# Patient Record
Sex: Male | Born: 1970 | Race: White | Hispanic: No | Marital: Single | Smoking: Current every day smoker
Health system: Southern US, Community
[De-identification: ages and names within clinical notes are randomized; demographics above are authoritative.]

## PROBLEM LIST (undated history)

## (undated) DIAGNOSIS — S149XXA Injury of unspecified nerves of neck, initial encounter: Secondary | ICD-10-CM

## (undated) DIAGNOSIS — K219 Gastro-esophageal reflux disease without esophagitis: Secondary | ICD-10-CM

## (undated) DIAGNOSIS — G589 Mononeuropathy, unspecified: Secondary | ICD-10-CM

## (undated) DIAGNOSIS — I10 Essential (primary) hypertension: Secondary | ICD-10-CM

---
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
## 2005-08-01 HISTORY — PX: SEPTOPLASTY: SUR1290

## 2006-05-25 ENCOUNTER — Ambulatory Visit: Payer: Self-pay | Admitting: Internal Medicine

## 2006-07-07 ENCOUNTER — Other Ambulatory Visit: Payer: Self-pay

## 2006-07-13 ENCOUNTER — Ambulatory Visit: Payer: Self-pay | Admitting: Otolaryngology

## 2010-07-20 ENCOUNTER — Observation Stay: Payer: Self-pay | Admitting: Internal Medicine

## 2015-05-11 ENCOUNTER — Other Ambulatory Visit: Payer: Self-pay | Admitting: Internal Medicine

## 2015-05-11 DIAGNOSIS — M5010 Cervical disc disorder with radiculopathy, unspecified cervical region: Secondary | ICD-10-CM

## 2015-05-20 ENCOUNTER — Ambulatory Visit: Payer: Self-pay

## 2015-07-10 ENCOUNTER — Ambulatory Visit
Admission: RE | Admit: 2015-07-10 | Discharge: 2015-07-10 | Disposition: A | Discharge status: Home / Self Care | Payer: 59 | Source: Ambulatory Visit | Attending: Internal Medicine | Admitting: Internal Medicine

## 2015-07-10 DIAGNOSIS — M5010 Cervical disc disorder with radiculopathy, unspecified cervical region: Secondary | ICD-10-CM | POA: Diagnosis present

## 2015-07-10 DIAGNOSIS — R202 Paresthesia of skin: Secondary | ICD-10-CM | POA: Insufficient documentation

## 2015-07-10 DIAGNOSIS — M2578 Osteophyte, vertebrae: Secondary | ICD-10-CM | POA: Insufficient documentation

## 2015-07-10 DIAGNOSIS — R531 Weakness: Secondary | ICD-10-CM | POA: Insufficient documentation

## 2015-07-10 DIAGNOSIS — R200 Anesthesia of skin: Secondary | ICD-10-CM | POA: Diagnosis not present

## 2015-07-10 DIAGNOSIS — M4802 Spinal stenosis, cervical region: Secondary | ICD-10-CM | POA: Insufficient documentation

## 2015-07-10 DIAGNOSIS — M50221 Other cervical disc displacement at C4-C5 level: Secondary | ICD-10-CM | POA: Diagnosis not present

## 2015-08-13 ENCOUNTER — Other Ambulatory Visit: Payer: Self-pay | Admitting: Nurse Practitioner

## 2015-08-13 DIAGNOSIS — Z800 Family history of malignant neoplasm of digestive organs: Secondary | ICD-10-CM

## 2015-08-13 DIAGNOSIS — Z8371 Family history of colonic polyps: Secondary | ICD-10-CM

## 2015-08-13 DIAGNOSIS — R197 Diarrhea, unspecified: Secondary | ICD-10-CM

## 2015-08-24 ENCOUNTER — Ambulatory Visit
Admission: RE | Admit: 2015-08-24 | Discharge: 2015-08-24 | Disposition: A | Discharge status: Home / Self Care | Payer: 59 | Source: Ambulatory Visit | Attending: Nurse Practitioner | Admitting: Nurse Practitioner

## 2015-08-24 ENCOUNTER — Encounter: Payer: Self-pay | Admitting: *Deleted

## 2015-08-24 DIAGNOSIS — K929 Disease of digestive system, unspecified: Secondary | ICD-10-CM | POA: Insufficient documentation

## 2015-08-24 DIAGNOSIS — Z800 Family history of malignant neoplasm of digestive organs: Secondary | ICD-10-CM | POA: Insufficient documentation

## 2015-08-24 DIAGNOSIS — Z8371 Family history of colonic polyps: Secondary | ICD-10-CM | POA: Diagnosis present

## 2015-08-24 DIAGNOSIS — R197 Diarrhea, unspecified: Secondary | ICD-10-CM | POA: Diagnosis present

## 2015-08-24 HISTORY — DX: Essential (primary) hypertension: I10

## 2015-08-24 MED ORDER — IOHEXOL 300 MG/ML  SOLN
100.0000 mL | Freq: Once | INTRAMUSCULAR | Status: AC | PRN
  Administered 2015-08-24: 100 mL via INTRAVENOUS

## 2015-08-25 NOTE — Discharge Instructions (Signed)

## 2015-08-26 ENCOUNTER — Ambulatory Visit
Admission: RE | Admit: 2015-08-26 | Discharge: 2015-08-26 | Disposition: A | Discharge status: Home / Self Care | Payer: 59 | Source: Ambulatory Visit | Attending: Gastroenterology | Admitting: Gastroenterology

## 2015-08-26 ENCOUNTER — Ambulatory Visit: Payer: 59

## 2015-08-26 ENCOUNTER — Ambulatory Visit: Payer: 59 | Admitting: Student in an Organized Health Care Education/Training Program

## 2015-08-26 ENCOUNTER — Encounter
Admission: RE | Disposition: A | Discharge status: Home / Self Care | Payer: Self-pay | Source: Ambulatory Visit | Attending: Gastroenterology

## 2015-08-26 DIAGNOSIS — Z79899 Other long term (current) drug therapy: Secondary | ICD-10-CM | POA: Diagnosis not present

## 2015-08-26 DIAGNOSIS — Z800 Family history of malignant neoplasm of digestive organs: Secondary | ICD-10-CM | POA: Insufficient documentation

## 2015-08-26 DIAGNOSIS — Z7982 Long term (current) use of aspirin: Secondary | ICD-10-CM | POA: Insufficient documentation

## 2015-08-26 DIAGNOSIS — Z881 Allergy status to other antibiotic agents status: Secondary | ICD-10-CM | POA: Diagnosis not present

## 2015-08-26 DIAGNOSIS — Z8049 Family history of malignant neoplasm of other genital organs: Secondary | ICD-10-CM | POA: Insufficient documentation

## 2015-08-26 DIAGNOSIS — Z888 Allergy status to other drugs, medicaments and biological substances status: Secondary | ICD-10-CM | POA: Insufficient documentation

## 2015-08-26 DIAGNOSIS — K449 Diaphragmatic hernia without obstruction or gangrene: Secondary | ICD-10-CM | POA: Diagnosis not present

## 2015-08-26 DIAGNOSIS — F17200 Nicotine dependence, unspecified, uncomplicated: Secondary | ICD-10-CM | POA: Diagnosis not present

## 2015-08-26 DIAGNOSIS — Z880 Allergy status to penicillin: Secondary | ICD-10-CM | POA: Diagnosis not present

## 2015-08-26 DIAGNOSIS — K529 Noninfective gastroenteritis and colitis, unspecified: Secondary | ICD-10-CM | POA: Insufficient documentation

## 2015-08-26 DIAGNOSIS — R197 Diarrhea, unspecified: Secondary | ICD-10-CM | POA: Diagnosis not present

## 2015-08-26 DIAGNOSIS — Z885 Allergy status to narcotic agent status: Secondary | ICD-10-CM | POA: Insufficient documentation

## 2015-08-26 DIAGNOSIS — Z86010 Personal history of colonic polyps: Secondary | ICD-10-CM | POA: Diagnosis not present

## 2015-08-26 DIAGNOSIS — Z8371 Family history of colonic polyps: Secondary | ICD-10-CM | POA: Diagnosis present

## 2015-08-26 DIAGNOSIS — Z803 Family history of malignant neoplasm of breast: Secondary | ICD-10-CM | POA: Insufficient documentation

## 2015-08-26 HISTORY — DX: Mononeuropathy, unspecified: G58.9

## 2015-08-26 HISTORY — PX: ESOPHAGOGASTRODUODENOSCOPY: SHX5428

## 2015-08-26 HISTORY — DX: Injury of unspecified nerves of neck, initial encounter: S14.9XXA

## 2015-08-26 HISTORY — PX: COLONOSCOPY: SHX5424

## 2015-08-26 HISTORY — DX: Gastro-esophageal reflux disease without esophagitis: K21.9

## 2015-08-26 SURGERY — COLONOSCOPY
Anesthesia: General | Wound class: Contaminated

## 2015-08-26 MED ORDER — DEXAMETHASONE SODIUM PHOSPHATE 4 MG/ML IJ SOLN: 8.0000 mg | Freq: Once | INTRAMUSCULAR | Status: DC | PRN

## 2015-08-26 MED ORDER — ACETAMINOPHEN 160 MG/5ML PO SOLN: 325.0000 mg | ORAL | Status: DC | PRN

## 2015-08-26 MED ORDER — LACTATED RINGERS IV SOLN: INTRAVENOUS | Status: DC

## 2015-08-26 MED ORDER — LACTATED RINGERS IV SOLN: 500.0000 mL | INTRAVENOUS | Status: DC

## 2015-08-26 MED ORDER — STERILE WATER FOR IRRIGATION IR SOLN
Status: DC | PRN
  Administered 2015-08-26: 10:00:00

## 2015-08-26 MED ORDER — FENTANYL CITRATE (PF) 100 MCG/2ML IJ SOLN: 25.0000 ug | INTRAMUSCULAR | Status: DC | PRN

## 2015-08-26 MED ORDER — PROPOFOL 10 MG/ML IV BOLUS
INTRAVENOUS | Status: DC | PRN
  Administered 2015-08-26 (×14): 20 mg via INTRAVENOUS

## 2015-08-26 MED ORDER — ACETAMINOPHEN 325 MG PO TABS: 325.0000 mg | ORAL_TABLET | ORAL | Status: DC | PRN

## 2015-08-26 SURGICAL SUPPLY — 30 items
CANISTER SUCT 1200ML W/VALVE (MISCELLANEOUS) ×3 IMPLANT
FCP ESCP3.2XJMB 240X2.8X (MISCELLANEOUS)
FORCEPS BIOP RAD 4 LRG CAP 4 (CUTTING FORCEPS) ×3 IMPLANT
FORCEPS BIOP RJ4 240 W/NDL (MISCELLANEOUS)
FORCEPS ESCP3.2XJMB 240X2.8X (MISCELLANEOUS) IMPLANT
GOWN CVR UNV OPN BCK APRN NK (MISCELLANEOUS) ×1 IMPLANT
GOWN ISOL THUMB LOOP REG UNIV (MISCELLANEOUS) ×2
GOWN STRL REUS W/ TWL LRG LVL3 (GOWN DISPOSABLE) ×1 IMPLANT
GOWN STRL REUS W/TWL LRG LVL3 (GOWN DISPOSABLE) ×2
HEMOCLIP INSTINCT (CLIP) IMPLANT
INJECTOR VARIJECT VIN23 (MISCELLANEOUS) IMPLANT
KIT CO2 TUBING (TUBING) IMPLANT
KIT DEFENDO VALVE AND CONN (KITS) IMPLANT
KIT ENDO PROCEDURE OLY (KITS) ×3 IMPLANT
LIGATOR MULTIBAND 6SHOOTER MBL (MISCELLANEOUS) IMPLANT
MARKER SPOT ENDO TATTOO 5ML (MISCELLANEOUS) IMPLANT
PAD GROUND ADULT SPLIT (MISCELLANEOUS) IMPLANT
SNARE SHORT THROW 13M SML OVAL (MISCELLANEOUS) IMPLANT
SNARE SHORT THROW 30M LRG OVAL (MISCELLANEOUS) IMPLANT
SPOT EX ENDOSCOPIC TATTOO (MISCELLANEOUS)
SUCTION POLY TRAP 4CHAMBER (MISCELLANEOUS) IMPLANT
TRAP SUCTION POLY (MISCELLANEOUS) IMPLANT
TUBING CONN 6MMX3.1M (TUBING)
TUBING SUCTION CONN 0.25 STRL (TUBING) IMPLANT
UNDERPAD 30X60 958B10 (PK) (MISCELLANEOUS) IMPLANT
VALVE BIOPSY ENDO (VALVE) IMPLANT
VARIJECT INJECTOR VIN23 (MISCELLANEOUS)
WATER AUXILLARY (MISCELLANEOUS) IMPLANT
WATER STERILE IRR 250ML POUR (IV SOLUTION) IMPLANT
WATER STERILE IRR 500ML POUR (IV SOLUTION) IMPLANT

## 2015-08-26 NOTE — Anesthesia Postprocedure Evaluation (Signed)
Anesthesia Post Note  Patient: Levi Sheppard  Procedure(s) Performed: Procedure(s) (LRB): COLONOSCOPY (N/A) ESOPHAGOGASTRODUODENOSCOPY (EGD) (N/A)  Patient location during evaluation: PACU Anesthesia Type: MAC Level of consciousness: awake and alert Pain management: pain level controlled Vital Signs Assessment: post-procedure vital signs reviewed and stable Respiratory status: spontaneous breathing, nonlabored ventilation and respiratory function stable Cardiovascular status: blood pressure returned to baseline and stable Postop Assessment: no signs of nausea or vomiting Anesthetic complications: no    Juno Alers D Dontee Jaso

## 2015-08-26 NOTE — Anesthesia Preprocedure Evaluation (Signed)
Anesthesia Evaluation  Patient identified by MRN, date of birth, ID band Patient awake    Reviewed: Allergy & Precautions, H&P , NPO status , Patient's Chart, lab work & pertinent test results, reviewed documented beta blocker date and time   Airway Mallampati: II  TM Distance: >3 FB Neck ROM: full    Dental no notable dental hx.    Pulmonary neg pulmonary ROS, Current Smoker,    Pulmonary exam normal breath sounds clear to auscultation       Cardiovascular Exercise Tolerance: Good hypertension, negative cardio ROS   Rhythm:regular Rate:Normal     Neuro/Psych negative neurological ROS  negative psych ROS   GI/Hepatic Neg liver ROS, GERD  Medicated,  Endo/Other  negative endocrine ROS  Renal/GU negative Renal ROS  negative genitourinary   Musculoskeletal   Abdominal   Peds  Hematology negative hematology ROS (+)   Anesthesia Other Findings   Reproductive/Obstetrics negative OB ROS                             Anesthesia Physical Anesthesia Plan  ASA: II  Anesthesia Plan: General   Post-op Pain Management:    Induction:   Airway Management Planned:   Additional Equipment:   Intra-op Plan:   Post-operative Plan:   Informed Consent: I have reviewed the patients History and Physical, chart, labs and discussed the procedure including the risks, benefits and alternatives for the proposed anesthesia with the patient or authorized representative who has indicated his/her understanding and acceptance.   Dental Advisory Given  Plan Discussed with: CRNA  Anesthesia Plan Comments:         Anesthesia Quick Evaluation

## 2015-08-26 NOTE — Op Note (Addendum)
[facility] Gastroenterology Patient Name: Levi Sheppard Procedure Date: 08/26/2015 9:20 AM MRN: [id number] Account #: [id number] Date of Birth: 11/08/1970 Admit Type: Outpatient Age: 45 Room: [facility] OR ROOM 01 Gender: Male Note Status: Supervisor Override Procedure:         Colonoscopy Indications:       Chronic diarrhea, Family history of colonic polyps in a                     first-degree relative Providers:         Ezzard Standing. Bluford Kaufmann, MD Medicines:         Monitored Anesthesia Care Complications:     No immediate complications. Procedure:         Pre-Anesthesia Assessment:                    - Prior to the procedure, a History and Physical was                     performed, and patient medications, allergies and                     sensitivities were reviewed. The patient's tolerance of                     previous anesthesia was reviewed.                    - The risks and benefits of the procedure and the sedation                     options and risks were discussed with the patient. All                     questions were answered and informed consent was obtained.                    - After reviewing the risks and benefits, the patient was                     deemed in satisfactory condition to undergo the procedure.                    After obtaining informed consent, the colonoscope was                     passed under direct vision. Throughout the procedure, the                     patient's blood pressure, pulse, and oxygen saturations                     were monitored continuously. The Olympus CF-HQ190L                     Colonoscope (S#. [id number]) was introduced through the anus                     and advanced to the the cecum, identified by appendiceal                     orifice and ileocecal valve. The patient tolerated the                     procedure well. The quality of the bowel  preparation was                     fair. The colonoscopy was performed  with difficulty due to                     significant looping. Successful completion of the                     procedure was aided by using manual pressure. Findings:      The colon (entire examined portion) appeared normal. Biopsies for       histology were taken with a cold forceps from the entire colon for       evaluation of microscopic colitis. Impression:        - The entire examined colon is normal. Biopsied. Recommendation:    - Discharge patient to home.                    - Repeat colonoscopy in 5 years for surveillance.                    - The findings and recommendations were discussed with the                     patient. Procedure Code(s): --- Professional ---                    [id number], Colonoscopy, flexible; with biopsy, single or                     multiple Diagnosis Code(s): --- Professional ---                    K52.9, Noninfective gastroenteritis and colitis,                     unspecified                    Z83.71, Family history of colonic polyps CPT copyright 2014 American Medical Association. All rights reserved. The codes documented in this report are preliminary and upon coder review may  be revised to meet current compliance requirements. Wallace Cullens, MD 08/26/2015 9:53:06 AM This report has been signed electronically. Number of Addenda: 0 Note Initiated On: 08/26/2015 9:20 AM Scope Withdrawal Time: 0 hours 6 minutes 57 seconds  Total Procedure Duration: 0 hours 14 minutes 26 seconds       [facility]

## 2015-08-26 NOTE — Op Note (Signed)
[facility] Gastroenterology Patient Name: Levi Sheppard Procedure Date: 08/26/2015 9:20 AM MRN: [id number] Account #: [id number] Date of Birth: [birth year] Admit Type: Outpatient Age: 45 Room: [facility] OR ROOM 01 Gender: Male Note Status: Finalized Procedure:         Upper GI endoscopy Indications:       Suspected esophageal reflux Providers:         Ezzard Standing. Bluford Kaufmann, MD Referring MD:      Danella Penton, MD (Referring MD) Medicines:         Monitored Anesthesia Care Complications:     No immediate complications. Procedure:         Pre-Anesthesia Assessment:                    - Prior to the procedure, a History and Physical was                     performed, and patient medications, allergies and                     sensitivities were reviewed. The patient's tolerance of                     previous anesthesia was reviewed.                    - The risks and benefits of the procedure and the sedation                     options and risks were discussed with the patient. All                     questions were answered and informed consent was obtained.                    - After reviewing the risks and benefits, the patient was                     deemed in satisfactory condition to undergo the procedure.                    After obtaining informed consent, the endoscope was passed                     under direct vision. Throughout the procedure, the                     patient's blood pressure, pulse, and oxygen saturations                     were monitored continuously. The Olympus GIF H180J                     colonscope (M#:[id number]) was introduced through the mouth,                     and advanced to the second part of duodenum. The upper GI                     endoscopy was accomplished without difficulty. The patient                     tolerated the procedure well. Findings:      The examined esophagus was normal. Biopsies were taken with a  cold       forceps  for histology.      The examined duodenum was normal.      A small hiatus hernia was present.      The exam was otherwise without abnormality. Impression:        - Normal esophagus. Biopsied.                    - Normal examined duodenum.                    - Small hiatus hernia.                    - The examination was otherwise normal. Recommendation:    - Discharge patient to home.                    - Observe patient's clinical course.                    - Continue present medications.                    - Await pathology results.                    - The findings and recommendations were discussed with the                     patient. Procedure Code(s): --- Professional ---                    [phone], Esophagogastroduodenoscopy, flexible, transoral;                     with biopsy, single or multiple Diagnosis Code(s): --- Professional ---                    K44.9, Diaphragmatic hernia without obstruction or gangrene CPT copyright 2014 American Medical Association. All rights reserved. The codes documented in this report are preliminary and upon coder review may  be revised to meet current compliance requirements. Wallace Cullens, MD 08/26/2015 9:57:12 AM This report has been signed electronically. Number of Addenda: 0 Note Initiated On: 08/26/2015 9:20 AM      [facility]

## 2015-08-26 NOTE — Transfer of Care (Signed)
Immediate Anesthesia Transfer of Care Note  Patient: Levi Sheppard  Procedure(s) Performed: Procedure(s): COLONOSCOPY (N/A) ESOPHAGOGASTRODUODENOSCOPY (EGD) (N/A)  Patient Location: PACU  Anesthesia Type: General  Level of Consciousness: awake, alert  and patient cooperative  Airway and Oxygen Therapy: Patient Spontanous Breathing and Patient connected to supplemental oxygen  Post-op Assessment: Post-op Vital signs reviewed, Patient's Cardiovascular Status Stable, Respiratory Function Stable, Patent Airway and No signs of Nausea or vomiting  Post-op Vital Signs: Reviewed and stable  Complications: No apparent anesthesia complications

## 2015-08-26 NOTE — Anesthesia Procedure Notes (Signed)
Procedure Name: MAC Performed by: Charnee Turnipseed Pre-anesthesia Checklist: Patient identified, Emergency Drugs available, Suction available, Patient being monitored and Timeout performed Patient Re-evaluated:Patient Re-evaluated prior to inductionOxygen Delivery Method: Nasal cannula Preoxygenation: Pre-oxygenation with 100% oxygen     

## 2015-08-26 NOTE — H&P (Signed)
  Date of Initial H&P: 08/13/2015  History reviewed, patient examined, no change in status, stable for surgery.

## 2015-08-27 ENCOUNTER — Encounter: Payer: Self-pay | Admitting: Gastroenterology

## 2015-08-28 LAB — SURGICAL PATHOLOGY

## 2015-09-03 ENCOUNTER — Ambulatory Visit: Admit: 2015-09-03 | Payer: 59 | Admitting: Gastroenterology

## 2015-09-03 SURGERY — COLONOSCOPY WITH PROPOFOL
Anesthesia: General

## 2016-03-24 ENCOUNTER — Other Ambulatory Visit: Payer: Self-pay | Admitting: Internal Medicine

## 2016-03-24 DIAGNOSIS — M5010 Cervical disc disorder with radiculopathy, unspecified cervical region: Secondary | ICD-10-CM

## 2016-04-05 ENCOUNTER — Ambulatory Visit: Payer: 59

## 2016-04-05 ENCOUNTER — Ambulatory Visit
Admission: RE | Admit: 2016-04-05 | Discharge: 2016-04-05 | Disposition: A | Discharge status: Home / Self Care | Payer: 59 | Source: Ambulatory Visit | Attending: Internal Medicine | Admitting: Internal Medicine

## 2016-04-05 DIAGNOSIS — M4802 Spinal stenosis, cervical region: Secondary | ICD-10-CM | POA: Insufficient documentation

## 2016-04-05 DIAGNOSIS — M5010 Cervical disc disorder with radiculopathy, unspecified cervical region: Secondary | ICD-10-CM | POA: Diagnosis not present

## 2016-04-05 DIAGNOSIS — M50223 Other cervical disc displacement at C6-C7 level: Secondary | ICD-10-CM | POA: Diagnosis not present

## 2019-01-26 ENCOUNTER — Observation Stay
Admission: EM | Admit: 2019-01-26 | Discharge: 2019-01-28 | Disposition: A | Discharge status: Home / Self Care | Payer: Managed Care, Other (non HMO) | Attending: Internal Medicine | Admitting: Internal Medicine

## 2019-01-26 ENCOUNTER — Other Ambulatory Visit: Payer: Self-pay

## 2019-01-26 ENCOUNTER — Emergency Department: Payer: Managed Care, Other (non HMO)

## 2019-01-26 DIAGNOSIS — Z880 Allergy status to penicillin: Secondary | ICD-10-CM | POA: Diagnosis not present

## 2019-01-26 DIAGNOSIS — Z791 Long term (current) use of non-steroidal anti-inflammatories (NSAID): Secondary | ICD-10-CM | POA: Insufficient documentation

## 2019-01-26 DIAGNOSIS — I214 Non-ST elevation (NSTEMI) myocardial infarction: Secondary | ICD-10-CM | POA: Diagnosis present

## 2019-01-26 DIAGNOSIS — Z23 Encounter for immunization: Secondary | ICD-10-CM | POA: Diagnosis not present

## 2019-01-26 DIAGNOSIS — Z79899 Other long term (current) drug therapy: Secondary | ICD-10-CM | POA: Insufficient documentation

## 2019-01-26 DIAGNOSIS — E785 Hyperlipidemia, unspecified: Secondary | ICD-10-CM | POA: Diagnosis not present

## 2019-01-26 DIAGNOSIS — F17210 Nicotine dependence, cigarettes, uncomplicated: Secondary | ICD-10-CM | POA: Insufficient documentation

## 2019-01-26 DIAGNOSIS — R079 Chest pain, unspecified: Secondary | ICD-10-CM | POA: Diagnosis present

## 2019-01-26 DIAGNOSIS — Z1159 Encounter for screening for other viral diseases: Secondary | ICD-10-CM | POA: Diagnosis not present

## 2019-01-26 DIAGNOSIS — R0789 Other chest pain: Secondary | ICD-10-CM | POA: Diagnosis not present

## 2019-01-26 DIAGNOSIS — Z6841 Body Mass Index (BMI) 40.0 and over, adult: Secondary | ICD-10-CM | POA: Insufficient documentation

## 2019-01-26 DIAGNOSIS — F419 Anxiety disorder, unspecified: Secondary | ICD-10-CM | POA: Diagnosis present

## 2019-01-26 DIAGNOSIS — I10 Essential (primary) hypertension: Secondary | ICD-10-CM | POA: Diagnosis not present

## 2019-01-26 DIAGNOSIS — Z885 Allergy status to narcotic agent status: Secondary | ICD-10-CM | POA: Insufficient documentation

## 2019-01-26 DIAGNOSIS — Z888 Allergy status to other drugs, medicaments and biological substances status: Secondary | ICD-10-CM | POA: Insufficient documentation

## 2019-01-26 DIAGNOSIS — Z881 Allergy status to other antibiotic agents status: Secondary | ICD-10-CM | POA: Insufficient documentation

## 2019-01-26 DIAGNOSIS — Z7982 Long term (current) use of aspirin: Secondary | ICD-10-CM | POA: Diagnosis not present

## 2019-01-26 DIAGNOSIS — K219 Gastro-esophageal reflux disease without esophagitis: Secondary | ICD-10-CM | POA: Diagnosis not present

## 2019-01-26 LAB — URINALYSIS, COMPLETE (UACMP) WITH MICROSCOPIC
Bacteria, UA: NONE SEEN
Bilirubin Urine: NEGATIVE
Glucose, UA: NEGATIVE mg/dL
Hgb urine dipstick: NEGATIVE
Ketones, ur: NEGATIVE mg/dL
Leukocytes,Ua: NEGATIVE
Nitrite: NEGATIVE
Protein, ur: NEGATIVE mg/dL
Specific Gravity, Urine: 1.006 (ref 1.005–1.030)
Squamous Epithelial / HPF: NONE SEEN (ref 0–5)
pH: 6 (ref 5.0–8.0)

## 2019-01-26 LAB — HEPATIC FUNCTION PANEL
ALT: 35 U/L (ref 0–44)
AST: 30 U/L (ref 15–41)
Albumin: 4.1 g/dL (ref 3.5–5.0)
Alkaline Phosphatase: 57 U/L (ref 38–126)
Bilirubin, Direct: <0.1 mg/dL (ref 0.0–0.2)
Total Bilirubin: 0.6 mg/dL (ref 0.3–1.2)
Total Protein: 7.2 g/dL (ref 6.5–8.1)

## 2019-01-26 LAB — BASIC METABOLIC PANEL
Anion gap: 11 (ref 5–15)
BUN: 19 mg/dL (ref 6–20)
CO2: 25 mmol/L (ref 22–32)
Calcium: 9.1 mg/dL (ref 8.9–10.3)
Chloride: 100 mmol/L (ref 98–111)
Creatinine, Ser: 1.23 mg/dL (ref 0.61–1.24)
GFR calc Af Amer: >60 mL/min (ref >60)
GFR calc non Af Amer: >60 mL/min (ref >60)
Glucose, Bld: 94 mg/dL (ref 70–99)
Potassium: 4 mmol/L (ref 3.5–5.1)
Sodium: 136 mmol/L (ref 135–145)

## 2019-01-26 LAB — CBC
HCT: 35.2 % — ABNORMAL LOW (ref 39.0–52.0)
Hemoglobin: 10.8 g/dL — ABNORMAL LOW (ref 13.0–17.0)
MCH: 26.2 pg (ref 26.0–34.0)
MCHC: 30.7 g/dL (ref 30.0–36.0)
MCV: 85.2 fL (ref 80.0–100.0)
Platelets: 372 10*3/uL (ref 150–400)
RBC: 4.13 MIL/uL — ABNORMAL LOW (ref 4.22–5.81)
RDW: 14.7 % (ref 11.5–15.5)
WBC: 13.7 10*3/uL — ABNORMAL HIGH (ref 4.0–10.5)
nRBC: 0 % (ref 0.0–0.2)

## 2019-01-26 LAB — TROPONIN I (HIGH SENSITIVITY)
Troponin I (High Sensitivity): 94 ng/L — ABNORMAL HIGH (ref <18)
Troponin I (High Sensitivity): 198 ng/L (ref <18)

## 2019-01-26 MED ORDER — SODIUM CHLORIDE 0.9% FLUSH: 3.0000 mL | Freq: Once | INTRAVENOUS | Status: DC

## 2019-01-26 MED ORDER — SODIUM CHLORIDE 0.9 % IV BOLUS
500.0000 mL | Freq: Once | INTRAVENOUS | Status: AC
  Administered 2019-01-26: 500 mL via INTRAVENOUS

## 2019-01-26 NOTE — ED Triage Notes (Signed)
Reports working outside pressure washing the house, states working for 6 hours outside developed chest pain, along with n/v.

## 2019-01-26 NOTE — ED Provider Notes (Signed)
[facility] Emergency Department Provider Note  ____________________________________________   None    (approximate)  I have reviewed the triage vital signs and the nursing notes.   HISTORY  Chief Complaint Chest Pain   HPI Levi Sheppard is a 48 y.o. male who presents to the emergency department for treatment and evaluation of chest pain.  Patient was outside pressure washing for several hours today and began to have pain in the center of his chest.  He states that he does have a history of costochondritis and this felt similar, but he states that his left arm became slightly numb and tingly.  He denies any nausea.  He states that he was diaphoretic but again he was outside for significant period of time.  He was recently placed on doxycycline secondary to his Pseudomonas skin infection.  Otherwise, he has not had any recent illness.   Past Medical History:  Diagnosis Date  . GERD (gastroesophageal reflux disease)   . Hypertension   . Pinched nerve in neck    c4-C5. No limitations of neck movement    Patient Active Problem List   Diagnosis Date Noted  . Chest pain 01/27/2019  . HTN (hypertension) 01/27/2019  . GERD (gastroesophageal reflux disease) 01/27/2019  . Anxiety 01/27/2019    Past Surgical History:  Procedure Laterality Date  . COLONOSCOPY N/A 08/26/2015   Procedure: COLONOSCOPY;  Surgeon: Wallace CullensPaul Y Oh, MD;  [facility];  Service: Gastroenterology;  Laterality: N/A;  . ESOPHAGOGASTRODUODENOSCOPY N/A 08/26/2015   Procedure: ESOPHAGOGASTRODUODENOSCOPY (EGD);  Surgeon: Wallace CullensPaul Y Oh, MD;  [facility];  Service: Gastroenterology;  Laterality: N/A;  . NASAL SEPTUM SURGERY  1986  . SEPTOPLASTY  2007   with submucous resection    Prior to Admission medications   Medication Sig Start Date End Date Taking? Authorizing Provider  ALPRAZolam Prudy Feeler(XANAX) 0.25 MG tablet Take 0.25 mg by mouth at bedtime as needed for anxiety.    Yes [provider]  aspirin 325 MG EC tablet Take 325 mg by mouth daily.   Yes [provider]  doxycycline (PERIOSTAT) 20 MG tablet Take 20 mg by mouth 2 (two) times daily. 01/24/19  Yes [provider]  DULoxetine (CYMBALTA) 60 MG capsule Take 60 mg by mouth daily. AM   Yes [provider]  gabapentin (NEURONTIN) 300 MG capsule Take 300 mg by mouth daily. 11/11/18  Yes [provider]  Melatonin 10 MG CAPS Take 20 mg by mouth at bedtime.   Yes [provider]  meloxicam (MOBIC) 7.5 MG tablet Take 7.5 mg by mouth daily. 01/25/19  Yes [provider]  Multiple Vitamin (MULTI-VITAMIN) tablet Take 1 tablet by mouth daily.   Yes [provider]  olmesartan-hydrochlorothiazide (BENICAR HCT) 40-12.5 MG tablet Take 1 tablet by mouth daily. 01/12/19  Yes [provider]  omeprazole (PRILOSEC) 20 MG capsule Take 20 mg by mouth daily. AM   Yes [provider]  zolpidem (AMBIEN) 10 MG tablet Take 10 mg by mouth at bedtime as needed for sleep.   Yes [provider]    Allergies Codeine, Compazine [prochlorperazine], Keflex [cephalexin], and Penicillins  History reviewed. No pertinent family history.  Social History Social History   Tobacco Use  . Smoking status: Current Every Day Smoker    Packs/day: 0.50    Years: 20.00    Pack years: 10.00    Types: Cigarettes  . Smokeless tobacco: Never Used  Substance Use Topics  .  Alcohol use: Yes    Alcohol/week: 5.0 standard drinks    Types: 5 Glasses of wine per week  . Drug use: Not on file    Review of Systems  Constitutional: No fever/chills. Eyes: No visual changes. ENT: No sore throat. Cardiovascular: Positive for chest pain.  Negative for pleuritic pain.  Negative for palpitations.  Negative for leg pain. Respiratory: Negative shortness of breath. Gastrointestinal: Negative abdominal pain.  Negative for nausea, no vomiting.  No diarrhea.  No  constipation. Genitourinary: Negative for dysuria. Musculoskeletal: Negative for back pain.  Skin: Negative for rash, lesion, wound. Neurological: Negative for headaches, focal weakness or numbness. ____________________________________________   PHYSICAL EXAM:  VITAL SIGNS: ED Triage Vitals  Enc Vitals Group     BP 01/26/19 1855 (!) 150/77     Pulse Rate 01/26/19 1855 65     Resp 01/26/19 1855 18     Temp 01/26/19 1855 97.9 F (36.6 C)     Temp Source 01/26/19 1855 Oral     SpO2 01/26/19 1851 99 %     Weight 01/26/19 1900 148 lb 12.5 oz (67.5 kg)     Height 01/26/19 1900 6\' 4"  (1.93 m)     Head Circumference --      Peak Flow --      Pain Score 01/26/19 1859 6     Pain Loc --      Pain Edu? --      Excl. in [place]? --     Constitutional: Alert and oriented.  Overall well appearing and in no acute distress.  Normal mental status. Eyes: Conjunctivae are normal. PERRL. Head: Atraumatic. Nose: No congestion/rhinnorhea. Mouth/Throat: Mucous membranes are moist.  Oropharynx non-erythematous. Tongue normal in size and color. Neck: No stridor.  No carotid bruit appreciated on exam. Hematological/Lymphatic/Immunilogical: No cervical lymphadenopathy. Cardiovascular: Normal rate, regular rhythm. Grossly normal heart sounds.  Good peripheral circulation. Respiratory: Normal respiratory effort.  No retractions. Lungs CTAB. Gastrointestinal: Soft and nontender. No distention. No abdominal bruits. No CVA tenderness. Genitourinary: Exam deferred. Musculoskeletal: No lower extremity tenderness.  No edema of extremities. Neurologic:  Normal speech and language. No gross focal neurologic deficits are appreciated. Skin:  Skin is warm, dry and intact. No rash noted. Psychiatric: Mood and affect are normal. Speech and behavior are normal.  ____________________________________________   LABS (all labs ordered are listed, but only abnormal results are displayed)  Labs Reviewed  CBC -  Abnormal; Notable for the following components:      Result Value   WBC 13.7 (*)    RBC 4.13 (*)    Hemoglobin 10.8 (*)    HCT 35.2 (*)    All other components within normal limits  TROPONIN I (HIGH SENSITIVITY) - Abnormal; Notable for the following components:   Troponin I (High Sensitivity) 94 (*)    All other components within normal limits  TROPONIN I (HIGH SENSITIVITY) - Abnormal; Notable for the following components:   Troponin I (High Sensitivity) 198 (*)    All other components within normal limits  URINALYSIS, COMPLETE (UACMP) WITH MICROSCOPIC - Abnormal; Notable for the following components:   Color, Urine YELLOW (*)    APPearance CLEAR (*)    All other components within normal limits  BASIC METABOLIC PANEL - Abnormal; Notable for the following components:   Glucose, Bld 106 (*)    All other components within normal limits  CBC - Abnormal; Notable for the following components:   RBC 4.10 (*)    Hemoglobin 10.7 (*)  HCT 35.3 (*)    All other components within normal limits  TROPONIN I (HIGH SENSITIVITY) - Abnormal; Notable for the following components:   Troponin I (High Sensitivity) 177 (*)    All other components within normal limits  TROPONIN I (HIGH SENSITIVITY) - Abnormal; Notable for the following components:   Troponin I (High Sensitivity) 161 (*)    All other components within normal limits  TROPONIN I (HIGH SENSITIVITY) - Abnormal; Notable for the following components:   Troponin I (High Sensitivity) 119 (*)    All other components within normal limits  HEPARIN LEVEL (UNFRACTIONATED) - Abnormal; Notable for the following components:   Heparin Unfractionated <0.10 (*)    All other components within normal limits  HEPARIN LEVEL (UNFRACTIONATED) - Abnormal; Notable for the following components:   Heparin Unfractionated 0.11 (*)    All other components within normal limits  LIPID PANEL - Abnormal; Notable for the following components:   Cholesterol 214 (*)     Triglycerides 181 (*)    HDL 38 (*)    LDL Cholesterol 140 (*)    All other components within normal limits  HEPARIN LEVEL (UNFRACTIONATED) - Abnormal; Notable for the following components:   Heparin Unfractionated 0.23 (*)    All other components within normal limits  CBC - Abnormal; Notable for the following components:   Hemoglobin 11.3 (*)    HCT 36.3 (*)    All other components within normal limits  SARS CORONAVIRUS 2 (HOSPITAL ORDER, PERFORMED IN [facility])  BASIC METABOLIC PANEL  HEPATIC FUNCTION PANEL  APTT  PROTIME-INR  GLUCOSE, CAPILLARY  HEPARIN LEVEL (UNFRACTIONATED)  HIV ANTIBODY (ROUTINE TESTING W REFLEX)   ____________________________________________  EKG  ED ECG REPORT I, Charmine Bockrath, FNP-BC personally viewed and interpreted this ECG.   Date: 01/26/2019  EKG Time: 1859  Rate: 67  Rhythm: normal EKG, normal sinus rhythm  Axis: normal  Intervals:none  ST&T Change: no ST elevation  ____________________________________________  RADIOLOGY  ED MD interpretation:  X-ray negative for acute cardiopulmonary abnormality per radiology.  Official radiology report(s): No results found.  ____________________________________________   PROCEDURES  Procedure(s) performed: None  Procedures  Critical Care performed: No  ____________________________________________   INITIAL IMPRESSION / ASSESSMENT AND PLAN / ED COURSE  As part of my medical decision making, I reviewed the following data within the electronic MEDICAL RECORD NUMBER Evaluated by EM attending Dr. Marisa SeverinSiadecki.  48 year old male presenting to the emergency department for treatment and evaluation of chest pain.  Pain started after several hours of pressure washing.  Patient states that since he has cooled off and has rested the pain in the chest has significantly been reduced.  He states that the numbness and tingling in his left arm has also mostly resolved.  He is currently taking  doxycycline as well.  Plan will be to do serial troponins and cardiac work-up.  He denies any previous MI or CAD.  Initial troponin is 94, which is in the grey area based on new sensitive troponin.  Patient is resting comfortably in the bed at the moment.  Will give him 500 mils of fluid and have the second troponin drawn once its 2 hours past the initial draw.   Patient care relinquished to Dr. Marisa SeverinSiadecki who will follow the patient to disposition.      ____________________________________________   FINAL CLINICAL IMPRESSION(S) / ED DIAGNOSES  Final diagnoses:  NSTEMI (non-ST elevated myocardial infarction) [facility](HCC)     ED Discharge Orders  Ordered    aspirin 81 MG tablet  Daily,   Status:  Discontinued     01/28/19 1145    Increase activity slowly     01/28/19 1145    Diet - low sodium heart healthy     01/28/19 1145           Note:  This document was prepared using Dragon voice recognition software and may include unintentional dictation errors.    Chinita Pesterriplett, Carma Dwiggins B, FNP 01/28/19 1241    Dionne BucySiadecki, Sebastian, MD 01/28/19 1714

## 2019-01-26 NOTE — ED Notes (Signed)
Critical troponin 198 md aware.

## 2019-01-27 ENCOUNTER — Observation Stay
Admit: 2019-01-27 | Discharge: 2019-01-27 | Disposition: A | Discharge status: Home / Self Care | Payer: Managed Care, Other (non HMO) | Attending: Internal Medicine | Admitting: Internal Medicine

## 2019-01-27 ENCOUNTER — Other Ambulatory Visit: Payer: Self-pay

## 2019-01-27 ENCOUNTER — Encounter: Payer: Self-pay | Admitting: *Deleted

## 2019-01-27 DIAGNOSIS — K219 Gastro-esophageal reflux disease without esophagitis: Secondary | ICD-10-CM | POA: Diagnosis present

## 2019-01-27 DIAGNOSIS — F419 Anxiety disorder, unspecified: Secondary | ICD-10-CM | POA: Diagnosis present

## 2019-01-27 DIAGNOSIS — I10 Essential (primary) hypertension: Secondary | ICD-10-CM | POA: Diagnosis present

## 2019-01-27 DIAGNOSIS — R079 Chest pain, unspecified: Secondary | ICD-10-CM | POA: Diagnosis present

## 2019-01-27 LAB — CBC
HCT: 35.3 % — ABNORMAL LOW (ref 39.0–52.0)
Hemoglobin: 10.7 g/dL — ABNORMAL LOW (ref 13.0–17.0)
MCH: 26.1 pg (ref 26.0–34.0)
MCHC: 30.3 g/dL (ref 30.0–36.0)
MCV: 86.1 fL (ref 80.0–100.0)
Platelets: 354 10*3/uL (ref 150–400)
RBC: 4.1 MIL/uL — ABNORMAL LOW (ref 4.22–5.81)
RDW: 14.8 % (ref 11.5–15.5)
WBC: 8.5 10*3/uL (ref 4.0–10.5)
nRBC: 0 % (ref 0.0–0.2)

## 2019-01-27 LAB — ECHOCARDIOGRAM COMPLETE
Height: 75 in
Weight: 5148.8 oz

## 2019-01-27 LAB — HEPARIN LEVEL (UNFRACTIONATED)
Heparin Unfractionated: 0.11 IU/mL — ABNORMAL LOW (ref 0.30–0.70)
Heparin Unfractionated: 0.23 IU/mL — ABNORMAL LOW (ref 0.30–0.70)
Heparin Unfractionated: <0.1 IU/mL — ABNORMAL LOW (ref 0.30–0.70)

## 2019-01-27 LAB — TROPONIN I (HIGH SENSITIVITY)
Troponin I (High Sensitivity): 177 ng/L (ref <18)
Troponin I (High Sensitivity): 161 ng/L (ref <18)
Troponin I (High Sensitivity): 119 ng/L (ref <18)

## 2019-01-27 LAB — BASIC METABOLIC PANEL
Anion gap: 10 (ref 5–15)
BUN: 16 mg/dL (ref 6–20)
CO2: 26 mmol/L (ref 22–32)
Calcium: 9.1 mg/dL (ref 8.9–10.3)
Chloride: 104 mmol/L (ref 98–111)
Creatinine, Ser: 0.99 mg/dL (ref 0.61–1.24)
GFR calc Af Amer: >60 mL/min (ref >60)
GFR calc non Af Amer: >60 mL/min (ref >60)
Glucose, Bld: 106 mg/dL — ABNORMAL HIGH (ref 70–99)
Potassium: 3.8 mmol/L (ref 3.5–5.1)
Sodium: 140 mmol/L (ref 135–145)

## 2019-01-27 LAB — SARS CORONAVIRUS 2 BY RT PCR (HOSPITAL ORDER, PERFORMED IN ~~LOC~~ HOSPITAL LAB): SARS Coronavirus 2: NEGATIVE

## 2019-01-27 LAB — APTT: aPTT: 28 seconds (ref 24–36)

## 2019-01-27 LAB — PROTIME-INR
INR: 1.1 (ref 0.8–1.2)
Prothrombin Time: 14.2 seconds (ref 11.4–15.2)

## 2019-01-27 LAB — GLUCOSE, CAPILLARY: Glucose-Capillary: 97 mg/dL (ref 70–99)

## 2019-01-27 MED ORDER — ASPIRIN 81 MG PO CHEW
81.0000 mg | CHEWABLE_TABLET | ORAL | Status: AC
  Administered 2019-01-28: 81 mg via ORAL
  Filled 2019-01-27: qty 1

## 2019-01-27 MED ORDER — ASPIRIN EC 81 MG PO TBEC
162.0000 mg | DELAYED_RELEASE_TABLET | Freq: Every day | ORAL | Status: DC
  Administered 2019-01-27: 162 mg via ORAL
  Filled 2019-01-27: qty 2

## 2019-01-27 MED ORDER — HEPARIN (PORCINE) 25000 UT/250ML-% IV SOLN
1800.0000 [IU]/h | INTRAVENOUS | Status: DC
  Administered 2019-01-27: 1550 [IU]/h via INTRAVENOUS
  Filled 2019-01-27: qty 250

## 2019-01-27 MED ORDER — HEPARIN (PORCINE) 25000 UT/250ML-% IV SOLN
1200.0000 [IU]/h | INTRAVENOUS | Status: DC
  Administered 2019-01-27: 800 [IU]/h via INTRAVENOUS
  Filled 2019-01-27: qty 250

## 2019-01-27 MED ORDER — PANTOPRAZOLE SODIUM 40 MG PO TBEC
40.0000 mg | DELAYED_RELEASE_TABLET | Freq: Every day | ORAL | Status: DC
  Administered 2019-01-27 – 2019-01-28 (×2): 40 mg via ORAL
  Filled 2019-01-27 (×2): qty 1

## 2019-01-27 MED ORDER — LOSARTAN POTASSIUM 50 MG PO TABS
100.0000 mg | ORAL_TABLET | Freq: Every day | ORAL | Status: DC
  Administered 2019-01-27 – 2019-01-28 (×2): 100 mg via ORAL
  Filled 2019-01-27 (×2): qty 2

## 2019-01-27 MED ORDER — SODIUM CHLORIDE 0.9% FLUSH: 3.0000 mL | INTRAVENOUS | Status: DC | PRN

## 2019-01-27 MED ORDER — SODIUM CHLORIDE 0.9 % IV SOLN: 250.0000 mL | INTRAVENOUS | Status: DC | PRN

## 2019-01-27 MED ORDER — ACETAMINOPHEN 325 MG PO TABS
650.0000 mg | ORAL_TABLET | Freq: Four times a day (QID) | ORAL | Status: DC | PRN
  Administered 2019-01-27: 650 mg via ORAL
  Filled 2019-01-27: qty 2

## 2019-01-27 MED ORDER — HEPARIN BOLUS VIA INFUSION
3500.0000 [IU] | Freq: Once | INTRAVENOUS | Status: AC
  Administered 2019-01-27: 3500 [IU] via INTRAVENOUS
  Filled 2019-01-27: qty 3500

## 2019-01-27 MED ORDER — PNEUMOCOCCAL VAC POLYVALENT 25 MCG/0.5ML IJ INJ
0.5000 mL | INJECTION | INTRAMUSCULAR | Status: AC
  Administered 2019-01-28: 0.5 mL via INTRAMUSCULAR
  Filled 2019-01-27: qty 0.5

## 2019-01-27 MED ORDER — DULOXETINE HCL 30 MG PO CPEP
60.0000 mg | ORAL_CAPSULE | Freq: Every day | ORAL | Status: DC
  Administered 2019-01-27 – 2019-01-28 (×2): 60 mg via ORAL
  Filled 2019-01-27 (×2): qty 2

## 2019-01-27 MED ORDER — HEPARIN BOLUS VIA INFUSION
4000.0000 [IU] | Freq: Once | INTRAVENOUS | Status: AC
  Administered 2019-01-27: 4000 [IU] via INTRAVENOUS
  Filled 2019-01-27: qty 4000

## 2019-01-27 MED ORDER — ONDANSETRON HCL 4 MG PO TABS: 4.0000 mg | ORAL_TABLET | Freq: Four times a day (QID) | ORAL | Status: DC | PRN

## 2019-01-27 MED ORDER — ONDANSETRON HCL 4 MG/2ML IJ SOLN: 4.0000 mg | Freq: Four times a day (QID) | INTRAMUSCULAR | Status: DC | PRN

## 2019-01-27 MED ORDER — SODIUM CHLORIDE 0.9 % WEIGHT BASED INFUSION
3.0000 mL/kg/h | INTRAVENOUS | Status: AC
  Administered 2019-01-28: 3 mL/kg/h via INTRAVENOUS

## 2019-01-27 MED ORDER — SODIUM CHLORIDE 0.9% FLUSH
3.0000 mL | Freq: Two times a day (BID) | INTRAVENOUS | Status: DC
  Administered 2019-01-27 (×2): 3 mL via INTRAVENOUS

## 2019-01-27 MED ORDER — ACETAMINOPHEN 650 MG RE SUPP: 650.0000 mg | Freq: Four times a day (QID) | RECTAL | Status: DC | PRN

## 2019-01-27 MED ORDER — HYDROCHLOROTHIAZIDE 25 MG PO TABS
25.0000 mg | ORAL_TABLET | Freq: Every day | ORAL | Status: DC
  Administered 2019-01-27: 25 mg via ORAL
  Filled 2019-01-27: qty 1

## 2019-01-27 MED ORDER — ALPRAZOLAM 0.25 MG PO TABS: 0.2500 mg | ORAL_TABLET | Freq: Every evening | ORAL | Status: DC | PRN

## 2019-01-27 MED ORDER — LOSARTAN POTASSIUM-HCTZ 100-25 MG PO TABS: 1.0000 | ORAL_TABLET | Freq: Every day | ORAL | Status: DC

## 2019-01-27 MED ORDER — ZOLPIDEM TARTRATE 5 MG PO TABS
10.0000 mg | ORAL_TABLET | Freq: Every evening | ORAL | Status: DC | PRN
  Administered 2019-01-27: 10 mg via ORAL
  Filled 2019-01-27: qty 2

## 2019-01-27 MED ORDER — SODIUM CHLORIDE 0.9 % WEIGHT BASED INFUSION: 1.0000 mL/kg/h | INTRAVENOUS | Status: DC

## 2019-01-27 NOTE — Consult Note (Signed)
[facility] for heparin infusion  Indication: chest pain/ACS  Allergies  Allergen Reactions  . Codeine     rash  . Compazine [Prochlorperazine]     rash  . Keflex [Cephalexin]     hives  . Penicillins     rash   Patient Measurements: Height: 6\' 3"  (190.5 cm) Weight: (!) 321 lb 12.8 oz (146 kg) IBW/kg (Calculated) : 84.5  Heparin dosing weight: 118 kg  Vital Signs: Temp: 97.6 F (36.4 C) (06/28 0742) Temp Source: Oral (06/28 0742) BP: 127/76 (06/28 0742) Pulse Rate: 55 (06/28 0742)  Labs: Recent Labs    01/26/19 1908  01/27/19 0116 01/27/19 0425 01/27/19 0625 01/27/19 0954 01/27/19 0956  HGB 10.8*  --   --   --   --  10.7*  --   HCT 35.2*  --   --   --   --  35.3*  --   PLT 372  --   --   --   --  354  --   APTT  --   --  28  --   --   --   --   LABPROT  --   --  14.2  --   --   --   --   INR  --   --  1.1  --   --   --   --   HEPARINUNFRC  --   --   --   --   --   --  <0.10*  CREATININE 1.23  --   --   --   --  0.99  --   TROPONINIHS 94*   < >  --  177* 161* 119*  --    < > = values in this interval not displayed.    Estimated Creatinine Clearance: 142.3 mL/min (by C-G formula based on SCr of 0.99 mg/dL).   Medical History: Past Medical History:  Diagnosis Date  . GERD (gastroesophageal reflux disease)   . Hypertension   . Pinched nerve in neck    c4-C5. No limitations of neck movement    Assessment: Pharmacy consulted for heparin infusion dosing and monitoring for 48 yo male for ACS/STEMI.   Goal of Therapy:  Heparin level 0.3-0.7 units/ml Monitor platelets by anticoagulation protocol: Yes   Plan:  6/28 ~ 10:00  HL <0.10 Give 3500 units bolus x 1 Increase heparin drip rate to 1200 units/hr Check anti-Xa level in 6 hours and daily while on heparin Continue to monitor H&H and platelets   HL due tonight at 17:00.  Olivia Canter [facility] Clinical Pharmacist 01/27/2019 10:47 AM

## 2019-01-27 NOTE — H&P (Signed)
[facility] - [place] at [facility]   PATIENT NAME: Levi Sheppard    MR#:  [id number]  DATE OF BIRTH:  06-13-1971  DATE OF ADMISSION:  01/26/2019  PRIMARY CARE PHYSICIAN: Danella PentonMiller, Mark F, MD   REQUESTING/REFERRING PHYSICIAN: Marisa SeverinSiadecki, MD  CHIEF COMPLAINT:   Chief Complaint  Patient presents with  . Chest Pain    HISTORY OF PRESENT ILLNESS:  Levi CurdSamuel Frediani  is a 48 y.o. male who presents with chief complaint as above.  Presents the ED after an acute episode of chest pain this afternoon.  He states that he was outside pressure washing for about 5 hours when he developed central chest discomfort which radiated to his left arm and was accompanied by diaphoresis, nausea, lightheadedness, and shortness of breath.  After 15 minutes or so it started to improve, but continued to last for about 40 total minutes.  Here in the ED is found to have an elevated troponin.  Hospitalist were called for admission  PAST MEDICAL HISTORY:   Past Medical History:  Diagnosis Date  . GERD (gastroesophageal reflux disease)   . Hypertension   . Pinched nerve in neck    c4-C5. No limitations of neck movement     PAST SURGICAL HISTORY:   Past Surgical History:  Procedure Laterality Date  . COLONOSCOPY N/A 08/26/2015   Procedure: COLONOSCOPY;  Surgeon: Wallace CullensPaul Y Oh, MD;  [facility];  Service: Gastroenterology;  Laterality: N/A;  . ESOPHAGOGASTRODUODENOSCOPY N/A 08/26/2015   Procedure: ESOPHAGOGASTRODUODENOSCOPY (EGD);  Surgeon: Wallace CullensPaul Y Oh, MD;  [facility];  Service: Gastroenterology;  Laterality: N/A;  . NASAL SEPTUM SURGERY  1986  . SEPTOPLASTY  2007   with submucous resection     SOCIAL HISTORY:   Social History   Tobacco Use  . Smoking status: Current Every Day Smoker    Packs/day: 0.50    Years: 20.00    Pack years: 10.00    Types: Cigarettes  Substance Use Topics  . Alcohol use: Yes    Alcohol/week: 5.0 standard drinks   Types: 5 Glasses of wine per week     FAMILY HISTORY:    Family history reviewed and is non-contributory DRUG ALLERGIES:   Allergies  Allergen Reactions  . Codeine     rash  . Compazine [Prochlorperazine]     rash  . Keflex [Cephalexin]     hives  . Penicillins     rash    MEDICATIONS AT HOME:   Prior to Admission medications   Medication Sig Start Date End Date Taking? Authorizing Provider  ALPRAZolam Prudy Feeler(XANAX) 0.25 MG tablet Take 0.25 mg by mouth at bedtime as needed for anxiety.    [provider]  aspirin 81 MG tablet Take 162 mg by mouth daily. AM    [provider]  dicyclomine (BENTYL) 20 MG tablet Take 20 mg by mouth 3 (three) times daily before meals.    [provider]  diphenoxylate-atropine (LOMOTIL) 2.5-0.025 MG tablet Take 1 tablet by mouth 4 (four) times daily as needed for diarrhea or loose stools.    [provider]  DULoxetine (CYMBALTA) 60 MG capsule Take 60 mg by mouth daily. AM    [provider]  losartan-hydrochlorothiazide (HYZAAR) 100-25 MG tablet Take 1 tablet by mouth daily. AM    [provider]  omeprazole (PRILOSEC) 20 MG capsule Take 20 mg by mouth daily. AM    [provider]  saccharomyces boulardii (FLORASTOR) 250 MG capsule Take  250 mg by mouth 2 (two) times daily.    [provider]  sucralfate (CARAFATE) 1 g tablet Take 1 g by mouth 2 (two) times daily.    [provider]  zolpidem (AMBIEN) 10 MG tablet Take 10 mg by mouth at bedtime as needed for sleep.    [provider]    REVIEW OF SYSTEMS:  Review of Systems  Constitutional: Negative for chills, fever, malaise/fatigue and weight loss.  HENT: Negative for ear pain, hearing loss and tinnitus.   Eyes: Negative for blurred vision, double vision, pain and redness.  Respiratory: Positive for shortness of breath. Negative for cough and hemoptysis.   Cardiovascular: Positive for chest pain. Negative for  palpitations, orthopnea and leg swelling.  Gastrointestinal: Negative for abdominal pain, constipation, diarrhea, nausea and vomiting.  Genitourinary: Negative for dysuria, frequency and hematuria.  Musculoskeletal: Negative for back pain, joint pain and neck pain.  Skin:       No acne, rash, or lesions  Neurological: Negative for dizziness, tremors, focal weakness and weakness.  Endo/Heme/Allergies: Negative for polydipsia. Does not bruise/bleed easily.  Psychiatric/Behavioral: Negative for depression. The patient is not nervous/anxious and does not have insomnia.      VITAL SIGNS:   Vitals:   01/26/19 2145 01/26/19 2200 01/26/19 2215 01/26/19 2230  BP:  (!) 145/85  132/85  Pulse: 63 60 (!) 58 62  Resp: 16 12 12 11   Temp:      TempSrc:      SpO2: 98% 98% 96% 97%  Weight:      Height:       Wt Readings from Last 3 Encounters:  01/26/19 67.5 kg  08/26/15 131.1 kg    PHYSICAL EXAMINATION:  Physical Exam  Vitals reviewed. Constitutional: He is oriented to person, place, and time. He appears well-developed and well-nourished. No distress.  HENT:  Head: Normocephalic and atraumatic.  Mouth/Throat: Oropharynx is clear and moist.  Eyes: Pupils are equal, round, and reactive to light. Conjunctivae and EOM are normal. No scleral icterus.  Neck: Normal range of motion. Neck supple. No JVD present. No thyromegaly present.  Cardiovascular: Normal rate, regular rhythm and intact distal pulses. Exam reveals no gallop and no friction rub.  No murmur heard. Respiratory: Effort normal and breath sounds normal. No respiratory distress. He has no wheezes. He has no rales.  GI: Soft. Bowel sounds are normal. He exhibits no distension. There is no abdominal tenderness.  Musculoskeletal: Normal range of motion.        General: No edema.     Comments: No arthritis, no gout  Lymphadenopathy:    He has no cervical adenopathy.  Neurological: He is alert and oriented to person, place, and time.  No cranial nerve deficit.  No dysarthria, no aphasia  Skin: Skin is warm and dry. No rash noted. No erythema.  Psychiatric: He has a normal mood and affect. His behavior is normal. Judgment and thought content normal.    LABORATORY PANEL:   CBC Recent Labs  Lab 01/26/19 1908  WBC 13.7*  HGB 10.8*  HCT 35.2*  PLT 372   ------------------------------------------------------------------------------------------------------------------  Chemistries  Recent Labs  Lab 01/26/19 1908  NA 136  K 4.0  CL 100  CO2 25  GLUCOSE 94  BUN 19  CREATININE 1.23  CALCIUM 9.1  AST 30  ALT 35  ALKPHOS 57  BILITOT 0.6   ------------------------------------------------------------------------------------------------------------------  Cardiac Enzymes No results for input(s): TROPONINI in the last 168 hours. ------------------------------------------------------------------------------------------------------------------  RADIOLOGY:  Dg Chest 2 View  Result Date: 01/26/2019 CLINICAL DATA:  Chest pain EXAM: CHEST - 2 VIEW COMPARISON:  July 20, 2010 FINDINGS: The heart size is borderline enlarged. There is no pneumothorax. No large pleural effusion. No significant area of consolidation. There is no acute osseous abnormality. The patient appears to be status post prior ACDF of the lower cervical spine. There is a calcified structure projecting over the mediastinum which may represent a calcified lymph node. IMPRESSION: No active cardiopulmonary disease. Electronically Signed   By: Katherine Mantlehristopher  Green M.D.   On: 01/26/2019 19:21    EKG:   Orders placed or performed during the hospital encounter of 01/26/19  . ED EKG  . ED EKG    IMPRESSION AND PLAN:  Principal Problem:   Chest pain -troponin in the ED was initially 94, second check 198.  Symptoms have resolved.  We will start a heparin drip, continue to trend cardiac enzymes, get an echocardiogram and a cardiology consult Active  Problems:   HTN (hypertension) -home dose antihypertensives   Anxiety -home dose anxiolytic   GERD (gastroesophageal reflux disease) -home dose PPI  Chart review performed and case discussed with ED provider. Labs, imaging and/or ECG reviewed by provider and discussed with patient/family. Management plans discussed with the patient and/or family.  COVID-19 status: Test pending  DVT PROPHYLAXIS: Systemic anticoagulation  GI PROPHYLAXIS:  PPI   ADMISSION STATUS: Observation  CODE STATUS: Full  TOTAL TIME TAKING CARE OF THIS PATIENT: 40 minutes.   This patient was evaluated in the context of the global COVID-19 pandemic, which necessitated consideration that the patient might be at risk for infection with the SARS-CoV-2 virus that causes COVID-19. Institutional protocols and algorithms that pertain to the evaluation of patients at risk for COVID-19 are in a state of rapid change based on information released by regulatory bodies including the CDC and federal and state organizations. These policies and algorithms were followed to the best of this provider's knowledge to date during the patient's care at this facility.  Barney DrainDavid F Avaiyah Strubel 01/27/2019, 12:29 AM  [facility]  Office  [phone]  CC: Primary care physician; Danella PentonMiller, Mark F, MD  Note:  This document was prepared using Dragon voice recognition software and may include unintentional dictation errors.

## 2019-01-27 NOTE — Plan of Care (Signed)
  Problem: Clinical Measurements: Goal: Respiratory complications will improve Outcome: Progressing   Problem: Activity: Goal: Risk for activity intolerance will decrease Outcome: Progressing Note: Steady gait independent in room   Problem: Nutrition: Goal: Adequate nutrition will be maintained Outcome: Progressing Note: NPO   Problem: Coping: Goal: Level of anxiety will decrease Outcome: Progressing   Problem: Pain Managment: Goal: General experience of comfort will improve Outcome: Progressing Note: Had one episode of chest pain a little after pt got to the floor , treated once with tylenol, which gave relief   Problem: Safety: Goal: Ability to remain free from injury will improve Outcome: Progressing   Problem: Skin Integrity: Goal: Risk for impaired skin integrity will decrease Outcome: Progressing

## 2019-01-27 NOTE — Consult Note (Signed)
ANTICOAGULATION CONSULT NOTE - Initial Consult  Pharmacy Consult for heparin infusion  Indication: chest pain/ACS  Allergies  Allergen Reactions  . Codeine     rash  . Compazine [Prochlorperazine]     rash  . Keflex [Cephalexin]     hives  . Penicillins     rash   Patient Measurements: Height: 6\' 4"  (193 cm) Weight: 148 lb 12.5 oz (67.5 kg) IBW/kg (Calculated) : 86.8  Vital Signs: Temp: 97.9 F (36.6 C) (06/27 1855) Temp Source: Oral (06/27 1855) BP: 132/85 (06/27 2230) Pulse Rate: 62 (06/27 2230)  Labs: Recent Labs    01/26/19 1908 01/26/19 2228  HGB 10.8*  --   HCT 35.2*  --   PLT 372  --   CREATININE 1.23  --   TROPONINIHS 94* 198*    Estimated Creatinine Clearance: 70.9 mL/min (by C-G formula based on SCr of 1.23 mg/dL).   Medical History: Past Medical History:  Diagnosis Date  . GERD (gastroesophageal reflux disease)   . Hypertension   . Pinched nerve in neck    c4-C5. No limitations of neck movement    Assessment: Pharmacy consulted for heparin infusion dosing and monitoring for 48 yo male for ACS/STEMI.   Goal of Therapy:  Heparin level 0.3-0.7 units/ml Monitor platelets by anticoagulation protocol: Yes   Plan:  Baseline labs ordered Give 4000 units bolus x 1 Start heparin infusion at 800 units/hr Check anti-Xa level in 6 hours and daily while on heparin Continue to monitor H&H and platelets   Pernell Dupre, PharmD, BCPS Clinical Pharmacist 01/27/2019 12:44 AM

## 2019-01-27 NOTE — ED Notes (Addendum)
ED TO INPATIENT HANDOFF REPORT  ED Nurse Name and Phone #: Jeannett SeniorStephen [phone]  S Name/Age/Gender Levi Sheppard 48 y.o. male Room/Bed: ED15A/ED15A  Code Status   Code Status: Not on file  Home/SNF/Other Home Patient oriented to: self, place, time and situation Is this baseline? 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    Allergies Allergies  Allergen Reactions  . Codeine     rash  . Compazine [Prochlorperazine]     rash  . Keflex [Cephalexin]     hives  . Penicillins     rash    Level of Care/Admitting Diagnosis ED Disposition    ED Disposition Condition Comment   Admit  [facility]ospital [facility]: [facility] [[id number]]  Level of Care: Telemetry [5]  Covid Evaluation: Screening Protocol (No Symptoms)  Diagnosis: Chest pain [[id number]][[id number]]  Admitting Physician: Oralia ManisWILLIS, DAVID [[id number]]  Attending Physician: Oralia ManisWILLIS, DAVID [[id number]]  Bed request comments: 2a  PT Class (Do Not Modify): Observation [104]  PT Acc Code (Do Not Modify): Observation [10022]       B Medical/Surgery History Past Medical History:  Diagnosis Date  . GERD (gastroesophageal reflux disease)   . Hypertension   . Pinched nerve in neck    c4-C5. No limitations of neck movement   Past Surgical History:  Procedure Laterality Date  . COLONOSCOPY N/A 08/26/2015   Procedure: COLONOSCOPY;  Surgeon: Wallace CullensPaul Y Oh, MD;  [facility];  Service: Gastroenterology;  Laterality: N/A;  . ESOPHAGOGASTRODUODENOSCOPY N/A 08/26/2015   Procedure: ESOPHAGOGASTRODUODENOSCOPY (EGD);  Surgeon: Wallace CullensPaul Y Oh, MD;  [facility];  Service: Gastroenterology;  Laterality: N/A;  . NASAL SEPTUM SURGERY  1986  . SEPTOPLASTY  2007   with submucous resection     A IV Location/Drains/Wounds Patient Lines/Drains/Airways Status    Active Line/Drains/Airways    Name:   Placement date:   Placement time:   Site:   Days:   Peripheral IV 01/26/19 Left Antecubital   01/26/19    1851    Antecubital   1   Incision (Closed) 08/26/15 Lip Other (Comment)   08/26/15    0934     1250   Incision (Closed) 08/26/15 Rectum Other (Comment)   08/26/15    0934     1250          Intake/Output Last 24 hours  Intake/Output Summary (Last 24 hours) at 01/27/2019 0228 Last data filed at 01/26/2019 2055 Gross per 24 hour  Intake 500 ml  Output 800 ml  Net -300 ml    Labs/Imaging Results for orders placed or performed during the hospital encounter of 01/26/19 (from the past 48 hour(s))  Basic metabolic panel     Status: None   Collection Time: 01/26/19  7:08 PM  Result Value Ref Range   Sodium 136 135 - 145 mmol/L   Potassium 4.0 3.5 - 5.1 mmol/L   Chloride 100 98 - 111 mmol/L   CO2 25 22 - 32 mmol/L   Glucose, Bld 94 70 - 99 mg/dL   BUN 19 6 - 20 mg/dL   Creatinine, Ser 2.131.23 0.61 - 1.24 mg/dL   Calcium 9.1 8.9 - 08.610.3 mg/dL   GFR calc non Af Amer >60 >60 mL/min   GFR calc Af Amer >60 >60 mL/min   Anion gap 11 5 - 15  Comment: Performed at [facility], [address] [place], [place] [address]  CBC     Status: Abnormal   Collection Time: 01/26/19  7:08 PM  Result Value Ref Range   WBC 13.7 (H) 4.0 - 10.5 K/uL   RBC 4.13 (L) 4.22 - 5.81 MIL/uL   Hemoglobin 10.8 (L) 13.0 - 17.0 g/dL   HCT 35.2 (L) 39.0 - 52.0 %   MCV 85.2 80.0 - 100.0 fL   MCH 26.2 26.0 - 34.0 pg   MCHC 30.7 30.0 - 36.0 g/dL   RDW 14.7 11.5 - 15.5 %   Platelets 372 150 - 400 K/uL   nRBC 0.0 0.0 - 0.2 %    Comment: Performed at [facility], [place], [place] [address]  Troponin I (High Sensitivity)     Status: Abnormal   Collection Time: 01/26/19  7:08 PM  Result Value Ref Range   Troponin I (High Sensitivity) 94 (H) <18 ng/L    Comment: (NOTE) Elevated high sensitivity troponin I (hsTnI) values and significant   changes across serial measurements may suggest ACS but many other  chronic and acute conditions are known to elevate hsTnI results.  Refer to the "Links" section for chest pain algorithms and additional  guidance. Performed at [facility], [address] [place], [place] [address]   Hepatic function panel     Status: None   Collection Time: 01/26/19  7:08 PM  Result Value Ref Range   Total Protein 7.2 6.5 - 8.1 g/dL   Albumin 4.1 3.5 - 5.0 g/dL   AST 30 15 - 41 U/L   ALT 35 0 - 44 U/L   Alkaline Phosphatase 57 38 - 126 U/L   Total Bilirubin 0.6 0.3 - 1.2 mg/dL   Bilirubin, Direct <0.1 0.0 - 0.2 mg/dL   Indirect Bilirubin NOT CALCULATED 0.3 - 0.9 mg/dL    Comment: Performed at [facility], [address] [place], [place] [address]  Urinalysis, Complete w Microscopic     Status: Abnormal   Collection Time: 01/26/19  7:42 PM  Result Value Ref Range   Color, Urine YELLOW (A) YELLOW   APPearance CLEAR (A) CLEAR   Specific Gravity, Urine 1.006 1.005 - 1.030   pH 6.0 5.0 - 8.0   Glucose, UA NEGATIVE NEGATIVE mg/dL   Hgb urine dipstick NEGATIVE NEGATIVE   Bilirubin Urine NEGATIVE NEGATIVE   Ketones, ur NEGATIVE NEGATIVE mg/dL   Protein, ur NEGATIVE NEGATIVE mg/dL   Nitrite NEGATIVE NEGATIVE   Leukocytes,Ua NEGATIVE NEGATIVE   WBC, UA 0-5 0 - 5 WBC/hpf   Bacteria, UA NONE SEEN NONE SEEN   Squamous Epithelial / LPF NONE SEEN 0 - 5   Mucus PRESENT    Hyaline Casts, UA PRESENT     Comment: Performed at [facility], [address] [place], [place] [address]  Troponin I (High Sensitivity)     Status: Abnormal   Collection Time: 01/26/19 10:28 PM  Result Value Ref Range   Troponin I (High Sensitivity) 198 (HH) <18 ng/L    Comment: CRITICAL RESULT CALLED TO, READ BACK BY AND VERIFIED WITH JANETTE PEREZ AT 2314 ON 01/26/19 RWW (NOTE) Elevated high sensitivity troponin I (hsTnI) values and significant  changes across serial measurements may suggest  ACS but many other  chronic and acute conditions are known to elevate hsTnI results.  Refer to the "Links" section for chest pain algorithms and additional  guidance. Performed at [facility],  [address] [place], [place] [address]   APTT     Status: None   Collection Time: 01/27/19  1:16 AM  Result Value Ref Range   aPTT 28 24 - 36 seconds    Comment: Performed at [facility], [address] [place], [place] [address]  Protime-INR     Status: None   Collection Time: 01/27/19  1:16 AM  Result Value Ref Range   Prothrombin Time 14.2 11.4 - 15.2 seconds   INR 1.1 0.8 - 1.2    Comment: (NOTE) INR goal varies based on device and disease states. Performed at [facility], [address] [place], [place] [address]    Dg Chest 2 View  Result Date: 01/26/2019 CLINICAL DATA:  Chest pain EXAM: CHEST - 2 VIEW COMPARISON:  July 20, 2010 FINDINGS: The heart size is borderline enlarged. There is no pneumothorax. No large pleural effusion. No significant area of consolidation. There is no acute osseous abnormality. The patient appears to be status post prior ACDF of the lower cervical spine. There is a calcified structure projecting over the mediastinum which may represent a calcified lymph node. IMPRESSION: No active cardiopulmonary disease. Electronically Signed   By: Katherine Mantlehristopher  Green M.D.   On: 01/26/2019 19:21    Pending Labs Unresulted Labs (From admission, onward)    Start     Ordered   01/27/19 0800  Heparin level (unfractionated)  Once-Timed,   STAT     01/27/19 0143   Signed and Held  HIV antibody (Routine Testing)  Once,   R     Signed and Held   Signed and Held  Basic metabolic panel  Tomorrow morning,   R     Signed and Held   Signed and Held  CBC  Tomorrow morning,   R     Signed and Held   Signed and Held  Troponin I (High Sensitivity)  STAT Now then every 2 hours,   R    Question:  Indication  Answer:  Suspect ACS   Signed and Held           Vitals/Pain Today's Vitals   01/27/19 0030 01/27/19 0100 01/27/19 0115 01/27/19 0130  BP: 134/76  131/73   Pulse: 68 69 64 66  Resp: 12 14 15 15   Temp:      TempSrc:      SpO2: 94% 95% 97% 97%  Weight:      Height:      PainSc:        Isolation Precautions No active isolations  Medications Medications  sodium chloride flush (NS) 0.9 % injection 3 mL (3 mLs Intravenous Not Given 01/26/19 1932)  heparin bolus via infusion 4,000 Units (4,000 Units Intravenous Bolus from Bag 01/27/19 0138)    Followed by  heparin ADULT infusion 100 units/mL (25000 units/26450mL sodium chloride 0.45%) (800 Units/hr Intravenous New Bag/Given 01/27/19 0140)  sodium chloride 0.9 % bolus 500 mL (0 mLs Intravenous Stopped 01/26/19 2055)    Mobility walks Low fall risk   Focused Assessments Cardiac Assessment Handoff:    No results found for: CKTOTAL, CKMB, CKMBINDEX, TROPONINI No results found for: DDIMER Does the Patient currently have chest pain? No      R Recommendations: See Admitting Provider Note  Report given to: Lexi, RN  Additional Notes:

## 2019-01-27 NOTE — Progress Notes (Signed)
[employer] - [place] [facility]                                                                                                                                                                                  Patient Demographics   Levi Sheppard, is a 48 y.o. male, DOB - 04-24-1971, WUJ:[id number]  Admit date - 01/26/2019   Admitting Physician Oralia Manisavid Willis, MD  Outpatient Primary MD for the patient is Danella PentonMiller, Mark F, MD   LOS - 0  Subjective: Patient states that he is having no further chest pain but his troponin levels increased overnight    Review of Systems:   CONSTITUTIONAL: No documented fever. No fatigue, weakness. No weight gain, no weight loss.  EYES: No blurry or double vision.  ENT: No tinnitus. No postnasal drip. No redness of the oropharynx.  RESPIRATORY: No cough, no wheeze, no hemoptysis. No dyspnea.  CARDIOVASCULAR: No chest pain. No orthopnea. No palpitations. No syncope.  GASTROINTESTINAL: No nausea, no vomiting or diarrhea. No abdominal pain. No melena or hematochezia.  GENITOURINARY: No dysuria or hematuria.  ENDOCRINE: No polyuria or nocturia. No heat or cold intolerance.  HEMATOLOGY: No anemia. No bruising. No bleeding.  INTEGUMENTARY: No rashes. No lesions.  MUSCULOSKELETAL: No arthritis. No swelling. No gout.  NEUROLOGIC: No numbness, tingling, or ataxia. No seizure-type activity.  PSYCHIATRIC: No anxiety. No insomnia. No ADD.    Vitals:   Vitals:   01/27/19 0230 01/27/19 0245 01/27/19 0402 01/27/19 0742  BP: 134/72  (!) 143/81 127/76  Pulse: 61 61 (!) 59 (!) 55  Resp: 12 14  17   Temp:   97.6 F (36.4 C) 97.6 F (36.4 C)  TempSrc:   Oral Oral  SpO2: 96% 98% 95% 98%  Weight:   (!) 146 kg   Height:   6\' 3"  (1.905 m)     Wt Readings from Last 3 Encounters:  01/27/19 (!) 146 kg  08/26/15 131.1 kg     Intake/Output Summary (Last 24 hours) at 01/27/2019 1528 Last data filed at 01/27/2019 1106 Gross per 24 hour  Intake 563.45  ml  Output 2000 ml  Net -1436.55 ml    Physical Exam:   GENERAL: Pleasant-appearing in no apparent distress.  HEAD, EYES, EARS, NOSE AND THROAT: Atraumatic, normocephalic. Extraocular muscles are intact. Pupils equal and reactive to light. Sclerae anicteric. No conjunctival injection. No oro-pharyngeal erythema.  NECK: Supple. There is no jugular venous distention. No bruits, no lymphadenopathy, no thyromegaly.  HEART: Regular rate and rhythm,. No murmurs, no rubs, no clicks.  LUNGS: Clear to auscultation bilaterally. No rales or rhonchi. No wheezes.  ABDOMEN: Soft, flat, nontender, nondistended. Has good bowel  sounds. No hepatosplenomegaly appreciated.  EXTREMITIES: No evidence of any cyanosis, clubbing, or peripheral edema.  +2 pedal and radial pulses bilaterally.  NEUROLOGIC: The patient is alert, awake, and oriented x3 with no focal motor or sensory deficits appreciated bilaterally.  SKIN: Moist and warm with no rashes appreciated.  Psych: Not anxious, depressed LN: No inguinal LN enlargement    Antibiotics   Anti-infectives (From admission, onward)   None      Medications   Scheduled Meds: . [START ON 01/28/2019] aspirin  81 mg Oral Pre-Cath  . aspirin EC  162 mg Oral Daily  . DULoxetine  60 mg Oral Daily  . losartan  100 mg Oral Daily  . pantoprazole  40 mg Oral Daily  . [START ON 01/28/2019] pneumococcal 23 valent vaccine  0.5 mL Intramuscular Tomorrow-1000  . sodium chloride flush  3 mL Intravenous Once  . sodium chloride flush  3 mL Intravenous Q12H   Continuous Infusions: . sodium chloride    . [START ON 01/28/2019] sodium chloride     Followed by  . [START ON 01/28/2019] sodium chloride    . heparin 1,200 Units/hr (01/27/19 1109)   PRN Meds:.sodium chloride, acetaminophen **OR** acetaminophen, ALPRAZolam, ondansetron **OR** ondansetron (ZOFRAN) IV, sodium chloride flush, zolpidem   Data Review:   Micro Results Recent Results (from the past 240 hour(s))   SARS Coronavirus 2 (CEPHEID - Performed in [facility] hospital lab), Hosp Order     Status: None   Collection Time: 01/27/19  9:14 AM   Specimen: Nasopharyngeal Swab  Result Value Ref Range Status   SARS Coronavirus 2 NEGATIVE NEGATIVE Final    Comment: (NOTE) If result is NEGATIVE SARS-CoV-2 target nucleic acids are NOT DETECTED. The SARS-CoV-2 RNA is generally detectable in upper and lower  respiratory specimens during the acute phase of infection. The lowest  concentration of SARS-CoV-2 viral copies this assay can detect is 250  copies / mL. A negative result does not preclude SARS-CoV-2 infection  and should not be used as the sole basis for treatment or other  patient management decisions.  A negative result may occur with  improper specimen collection / handling, submission of specimen other  than nasopharyngeal swab, presence of viral mutation(s) within the  areas targeted by this assay, and inadequate number of viral copies  (<250 copies / mL). A negative result must be combined with clinical  observations, patient history, and epidemiological information. If result is POSITIVE SARS-CoV-2 target nucleic acids are DETECTED. The SARS-CoV-2 RNA is generally detectable in upper and lower  respiratory specimens dur ing the acute phase of infection.  Positive  results are indicative of active infection with SARS-CoV-2.  Clinical  correlation with patient history and other diagnostic information is  necessary to determine patient infection status.  Positive results do  not rule out bacterial infection or co-infection with other viruses. If result is PRESUMPTIVE POSTIVE SARS-CoV-2 nucleic acids MAY BE PRESENT.   A presumptive positive result was obtained on the submitted specimen  and confirmed on repeat testing.  While 2019 novel coronavirus  (SARS-CoV-2) nucleic acids may be present in the submitted sample  additional confirmatory testing may be necessary for epidemiological   and / or clinical management purposes  to differentiate between  SARS-CoV-2 and other Sarbecovirus currently known to infect humans.  If clinically indicated additional testing with an alternate test  methodology [phone] is advised. The SARS-CoV-2 RNA is generally  detectable in upper and lower respiratory sp ecimens during the acute  phase of infection. The expected result is Negative. Fact Sheet for Patients:  BoilerBrush.com.cyhttps://www.fda.gov/media/136312/download Fact Sheet for Healthcare Providers: https://pope.com/https://www.fda.gov/media/136313/download This test is not yet approved or cleared by the [place] FDA and has been authorized for detection and/or diagnosis of SARS-CoV-2 by FDA under an Emergency Use Authorization (EUA).  This EUA will remain in effect (meaning this test can be used) for the duration of the COVID-19 declaration under Section 564(b)(1) of the Act, 21 U.S.C. section 360bbb-3(b)(1), unless the authorization is terminated or revoked sooner. Performed at [facility], [address] [place], [place] [address]     Radiology Reports Dg Chest 2 View  Result Date: 01/26/2019 CLINICAL DATA:  Chest pain EXAM: CHEST - 2 VIEW COMPARISON:  July 20, 2010 FINDINGS: The heart size is borderline enlarged. There is no pneumothorax. No large pleural effusion. No significant area of consolidation. There is no acute osseous abnormality. The patient appears to be status post prior ACDF of the lower cervical spine. There is a calcified structure projecting over the mediastinum which may represent a calcified lymph node. IMPRESSION: No active cardiopulmonary disease. Electronically Signed   By: Katherine Mantlehristopher  Green M.D.   On: 01/26/2019 19:21     CBC Recent Labs  Lab 01/26/19 1908 01/27/19 0954  WBC 13.7* 8.5  HGB 10.8* 10.7*  HCT 35.2* 35.3*  PLT 372 354  MCV 85.2 86.1  MCH 26.2 26.1  MCHC 30.7 30.3  RDW 14.7 14.8    Chemistries  Recent Labs  Lab 01/26/19 1908  01/27/19 0954  NA 136 140  K 4.0 3.8  CL 100 104  CO2 25 26  GLUCOSE 94 106*  BUN 19 16  CREATININE 1.23 0.99  CALCIUM 9.1 9.1  AST 30  --   ALT 35  --   ALKPHOS 57  --   BILITOT 0.6  --    ------------------------------------------------------------------------------------------------------------------ estimated creatinine clearance is 142.3 mL/min (by C-G formula based on SCr of 0.99 mg/dL). ------------------------------------------------------------------------------------------------------------------ No results for input(s): HGBA1C in the last 72 hours. ------------------------------------------------------------------------------------------------------------------ No results for input(s): CHOL, HDL, LDLCALC, TRIG, CHOLHDL, LDLDIRECT in the last 72 hours. ------------------------------------------------------------------------------------------------------------------ No results for input(s): TSH, T4TOTAL, T3FREE, THYROIDAB in the last 72 hours.  Invalid input(s): FREET3 ------------------------------------------------------------------------------------------------------------------ No results for input(s): VITAMINB12, FOLATE, FERRITIN, TIBC, IRON, RETICCTPCT in the last 72 hours.  Coagulation profile Recent Labs  Lab 01/27/19 0116  INR 1.1    No results for input(s): DDIMER in the last 72 hours.  Cardiac Enzymes No results for input(s): CKMB, TROPONINI, MYOGLOBIN in the last 168 hours.  Invalid input(s): CK ------------------------------------------------------------------------------------------------------------------ Invalid input(s): POCBNP    Assessment & Plan  Patient is 48 year old presenting with chest pain   #1 chest pain -troponin in the ED was initially 94, second check 198.   Continue heparin drip Plan for cardiac cath tomorrow #2  HTN (hypertension) -home dose antihypertensives continue Cozaar #3  Anxiety -home dose anxiolytic  #4 GERD  (gastroesophageal reflux disease) -home dose PPI      Code Status Orders  (From admission, onward)         Start     Ordered   01/27/19 0401  Full code  Continuous     01/27/19 0400        Code Status History    This patient has a current code status but no historical code status.   Advance Care Planning Activity           Consults cardiology   DVT Prophylaxis Heparin  Lab Results  Component Value Date   PLT 354 01/27/2019     Time Spent in minutes 45 minutes greater than 50% of time spent in care coordination and counseling patient regarding the condition and plan of care.   Dustin Flock M.D on 01/27/2019 at 3:28 PM  Between 7am to 6pm - Pager - [phone]  After 6pm go to [email or web address] - Proofreader  [employer]   Office  [phone]

## 2019-01-27 NOTE — Consult Note (Signed)
Levi BashSamuel L Sheppard is a 48 y.o. male  [id number]  Primary Cardiologist: Adrian BlackwaterShaukat Levi Sheppard Reason for Consultation: Non-STEMI  HPI: This is a 48 year old white male with a history of hypertension morbid obesity presented to the hospital with chest pain described as pressure type associated with shortness of breath and diaphoresis.  Patient was found to have elevated troponin and was explained that patient may be having small non-Q wave MI.   Review of Systems: No chest pain at this time   Past Medical History:  Diagnosis Date  . GERD (gastroesophageal reflux disease)   . Hypertension   . Pinched nerve in neck    c4-C5. No limitations of neck movement    Medications Prior to Admission  Medication Sig Dispense Refill  . ALPRAZolam (XANAX) 0.25 MG tablet Take 0.25 mg by mouth at bedtime as needed for anxiety.    Marland Kitchen. aspirin 325 MG EC tablet Take 325 mg by mouth daily.    Marland Kitchen. doxycycline (PERIOSTAT) 20 MG tablet Take 20 mg by mouth 2 (two) times daily.    . DULoxetine (CYMBALTA) 60 MG capsule Take 60 mg by mouth daily. AM    . gabapentin (NEURONTIN) 300 MG capsule Take 300 mg by mouth daily.    . Melatonin 10 MG CAPS Take 20 mg by mouth at bedtime.    . meloxicam (MOBIC) 7.5 MG tablet Take 7.5 mg by mouth daily.    . Multiple Vitamin (MULTI-VITAMIN) tablet Take 1 tablet by mouth daily.    Marland Kitchen. olmesartan-hydrochlorothiazide (BENICAR HCT) 40-12.5 MG tablet Take 1 tablet by mouth daily.    Marland Kitchen. omeprazole (PRILOSEC) 20 MG capsule Take 20 mg by mouth daily. AM    . zolpidem (AMBIEN) 10 MG tablet Take 10 mg by mouth at bedtime as needed for sleep.       Marland Kitchen. aspirin EC  162 mg Oral Daily  . DULoxetine  60 mg Oral Daily  . losartan  100 mg Oral Daily   And  . hydrochlorothiazide  25 mg Oral Daily  . pantoprazole  40 mg Oral Daily  . [START ON 01/28/2019] pneumococcal 23 valent vaccine  0.5 mL Intramuscular Tomorrow-1000  . sodium chloride flush  3 mL Intravenous Once  . sodium chloride flush  3 mL  Intravenous Q12H    Infusions: . heparin 800 Units/hr (01/27/19 0440)    Allergies  Allergen Reactions  . Codeine     rash  . Compazine [Prochlorperazine]     rash  . Keflex [Cephalexin]     hives  . Penicillins     rash    Social History   Socioeconomic History  . Marital status: Single    Spouse name: Not on file  . Number of children: Not on file  . Years of education: Not on file  . Highest education level: Not on file  Occupational History  . Not on file  Social Needs  . Financial resource strain: Not on file  . Food insecurity    Worry: Not on file    Inability: Not on file  . Transportation needs    Medical: Not on file    Non-medical: Not on file  Tobacco Use  . Smoking status: Current Every Day Smoker    Packs/day: 0.50    Years: 20.00    Pack years: 10.00    Types: Cigarettes  . Smokeless tobacco: Never Used  Substance and Sexual Activity  . Alcohol use: Yes    Alcohol/week: 5.0 standard drinks  Types: 5 Glasses of wine per week  . Drug use: Not on file  . Sexual activity: Not on file  Lifestyle  . Physical activity    Days per week: Not on file    Minutes per session: Not on file  . Stress: Not on file  Relationships  . Social Herbalist on phone: Not on file    Gets together: Not on file    Attends religious service: Not on file    Active member of club or organization: Not on file    Attends meetings of clubs or organizations: Not on file    Relationship status: Not on file  . Intimate partner violence    Fear of current or ex partner: Not on file    Emotionally abused: Not on file    Physically abused: Not on file    Forced sexual activity: Not on file  Other Topics Concern  . Not on file  Social History Narrative  . Not on file    History reviewed. No pertinent family history.  PHYSICAL EXAM: Vitals:   01/27/19 0402 01/27/19 0742  BP: (!) 143/81 127/76  Pulse: (!) 59 (!) 55  Resp:  17  Temp: 97.6 F (36.4 C)  97.6 F (36.4 C)  SpO2: 95% 98%     Intake/Output Summary (Last 24 hours) at 01/27/2019 1029 Last data filed at 01/27/2019 0506 Gross per 24 hour  Intake 563.45 ml  Output 1300 ml  Net -736.55 ml    General:  Well appearing. No respiratory difficulty HEENT: normal Neck: supple. no JVD. Carotids 2+ bilat; no bruits. No lymphadenopathy or thryomegaly appreciated. Cor: PMI nondisplaced. Regular rate & rhythm. No rubs, gallops or murmurs. Lungs: clear Abdomen: soft, nontender, nondistended. No hepatosplenomegaly. No bruits or masses. Good bowel sounds. Extremities: no cyanosis, clubbing, rash, edema Neuro: alert & oriented x 3, cranial nerves grossly intact. moves all 4 extremities w/o difficulty. Affect pleasant.  ECG: Normal sinus rhythm no acute changes  Results for orders placed or performed during the hospital encounter of 01/26/19 (from the past 24 hour(s))  Basic metabolic panel     Status: None   Collection Time: 01/26/19  7:08 PM  Result Value Ref Range   Sodium 136 135 - 145 mmol/L   Potassium 4.0 3.5 - 5.1 mmol/L   Chloride 100 98 - 111 mmol/L   CO2 25 22 - 32 mmol/L   Glucose, Bld 94 70 - 99 mg/dL   BUN 19 6 - 20 mg/dL   Creatinine, Ser 1.23 0.61 - 1.24 mg/dL   Calcium 9.1 8.9 - 10.3 mg/dL   GFR calc non Af Amer >60 >60 mL/min   GFR calc Af Amer >60 >60 mL/min   Anion gap 11 5 - 15  CBC     Status: Abnormal   Collection Time: 01/26/19  7:08 PM  Result Value Ref Range   WBC 13.7 (H) 4.0 - 10.5 K/uL   RBC 4.13 (L) 4.22 - 5.81 MIL/uL   Hemoglobin 10.8 (L) 13.0 - 17.0 g/dL   HCT 35.2 (L) 39.0 - 52.0 %   MCV 85.2 80.0 - 100.0 fL   MCH 26.2 26.0 - 34.0 pg   MCHC 30.7 30.0 - 36.0 g/dL   RDW 14.7 11.5 - 15.5 %   Platelets 372 150 - 400 K/uL   nRBC 0.0 0.0 - 0.2 %  Troponin I (High Sensitivity)     Status: Abnormal   Collection Time: 01/26/19  7:08  PM  Result Value Ref Range   Troponin I (High Sensitivity) 94 (H) <18 ng/L  Hepatic function panel     Status: None    Collection Time: 01/26/19  7:08 PM  Result Value Ref Range   Total Protein 7.2 6.5 - 8.1 g/dL   Albumin 4.1 3.5 - 5.0 g/dL   AST 30 15 - 41 U/L   ALT 35 0 - 44 U/L   Alkaline Phosphatase 57 38 - 126 U/L   Total Bilirubin 0.6 0.3 - 1.2 mg/dL   Bilirubin, Direct <1.6<0.1 0.0 - 0.2 mg/dL   Indirect Bilirubin NOT CALCULATED 0.3 - 0.9 mg/dL  Urinalysis, Complete w Microscopic     Status: Abnormal   Collection Time: 01/26/19  7:42 PM  Result Value Ref Range   Color, Urine YELLOW (A) YELLOW   APPearance CLEAR (A) CLEAR   Specific Gravity, Urine 1.006 1.005 - 1.030   pH 6.0 5.0 - 8.0   Glucose, UA NEGATIVE NEGATIVE mg/dL   Hgb urine dipstick NEGATIVE NEGATIVE   Bilirubin Urine NEGATIVE NEGATIVE   Ketones, ur NEGATIVE NEGATIVE mg/dL   Protein, ur NEGATIVE NEGATIVE mg/dL   Nitrite NEGATIVE NEGATIVE   Leukocytes,Ua NEGATIVE NEGATIVE   WBC, UA 0-5 0 - 5 WBC/hpf   Bacteria, UA NONE SEEN NONE SEEN   Squamous Epithelial / LPF NONE SEEN 0 - 5   Mucus PRESENT    Hyaline Casts, UA PRESENT   Troponin I (High Sensitivity)     Status: Abnormal   Collection Time: 01/26/19 10:28 PM  Result Value Ref Range   Troponin I (High Sensitivity) 198 (HH) <18 ng/L  APTT     Status: None   Collection Time: 01/27/19  1:16 AM  Result Value Ref Range   aPTT 28 24 - 36 seconds  Protime-INR     Status: None   Collection Time: 01/27/19  1:16 AM  Result Value Ref Range   Prothrombin Time 14.2 11.4 - 15.2 seconds   INR 1.1 0.8 - 1.2  Troponin I (High Sensitivity)     Status: Abnormal   Collection Time: 01/27/19  4:25 AM  Result Value Ref Range   Troponin I (High Sensitivity) 177 (HH) <18 ng/L  Troponin I (High Sensitivity)     Status: Abnormal   Collection Time: 01/27/19  6:25 AM  Result Value Ref Range   Troponin I (High Sensitivity) 161 (HH) <18 ng/L  Glucose, capillary     Status: None   Collection Time: 01/27/19  7:43 AM  Result Value Ref Range   Glucose-Capillary 97 70 - 99 mg/dL  SARS Coronavirus 2  (CEPHEID - Performed in [facility] hospital lab), Hosp Order     Status: None   Collection Time: 01/27/19  9:14 AM   Specimen: Nasopharyngeal Swab  Result Value Ref Range   SARS Coronavirus 2 NEGATIVE NEGATIVE  CBC     Status: Abnormal   Collection Time: 01/27/19  9:54 AM  Result Value Ref Range   WBC 8.5 4.0 - 10.5 K/uL   RBC 4.10 (L) 4.22 - 5.81 MIL/uL   Hemoglobin 10.7 (L) 13.0 - 17.0 g/dL   HCT 10.935.3 (L) 60.439.0 - 54.052.0 %   MCV 86.1 80.0 - 100.0 fL   MCH 26.1 26.0 - 34.0 pg   MCHC 30.3 30.0 - 36.0 g/dL   RDW 98.114.8 19.111.5 - 47.815.5 %   Platelets 354 150 - 400 K/uL   nRBC 0.0 0.0 - 0.2 %  Heparin level (unfractionated)  Status: Abnormal   Collection Time: 01/27/19  9:56 AM  Result Value Ref Range   Heparin Unfractionated <0.10 (L) 0.30 - 0.70 IU/mL   Dg Chest 2 View  Result Date: 01/26/2019 CLINICAL DATA:  Chest pain EXAM: CHEST - 2 VIEW COMPARISON:  July 20, 2010 FINDINGS: The heart size is borderline enlarged. There is no pneumothorax. No large pleural effusion. No significant area of consolidation. There is no acute osseous abnormality. The patient appears to be status post prior ACDF of the lower cervical spine. There is a calcified structure projecting over the mediastinum which may represent a calcified lymph node. IMPRESSION: No active cardiopulmonary disease. Electronically Signed   By: Katherine Mantlehristopher  Green M.D.   On: 01/26/2019 19:21     ASSESSMENT AND PLAN: Non-STEMI with elevated troponin and typical anginal type chest pain with early repolarization abnormality on EKG with no ST elevation..  Patient was explained risk and benefits and patient will undergo left heart catheterization in the mid morning via right radial.  Patient has agreed to the procedure.  Marcellina Jonsson A

## 2019-01-27 NOTE — Consult Note (Signed)
[facility] for heparin infusion  Indication: chest pain/ACS  Allergies  Allergen Reactions  . Codeine     rash  . Compazine [Prochlorperazine]     rash  . Keflex [Cephalexin]     hives  . Penicillins     rash   Patient Measurements: Height: 6\' 3"  (190.5 cm) Weight: (!) 321 lb 12.8 oz (146 kg) IBW/kg (Calculated) : 84.5  Heparin dosing weight: 118 kg  Vital Signs: Temp: 97.8 F (36.6 C) (06/28 1610) Temp Source: Oral (06/28 1610) BP: 125/84 (06/28 1610) Pulse Rate: 64 (06/28 1610)  Labs: Recent Labs    01/26/19 1908  01/27/19 0116 01/27/19 0425 01/27/19 0625 01/27/19 0954 01/27/19 0956 01/27/19 1651  HGB 10.8*  --   --   --   --  10.7*  --   --   HCT 35.2*  --   --   --   --  35.3*  --   --   PLT 372  --   --   --   --  354  --   --   APTT  --   --  28  --   --   --   --   --   LABPROT  --   --  14.2  --   --   --   --   --   INR  --   --  1.1  --   --   --   --   --   HEPARINUNFRC  --   --   --   --   --   --  <0.10* 0.11*  CREATININE 1.23  --   --   --   --  0.99  --   --   TROPONINIHS 94*   < >  --  177* 161* 119*  --   --    < > = values in this interval not displayed.    Estimated Creatinine Clearance: 142.3 mL/min (by C-G formula based on SCr of 0.99 mg/dL).   Medical History: Past Medical History:  Diagnosis Date  . GERD (gastroesophageal reflux disease)   . Hypertension   . Pinched nerve in neck    c4-C5. No limitations of neck movement    Assessment: Pharmacy consulted for heparin infusion dosing and monitoring for 48 yo male for ACS/STEMI.   6/28 ~ 10:00  HL <0.10. 3500 units bolus x 1 given and increased heparin drip rate to 1200 units/hr. Confirmed with nurse there were no heparin interruptions. 6/28 HL @1651  0.11. Subtherapeutic. Will Bolus using 30 units/kg and increase rate by 3 units/kg/hr   Goal of Therapy:  Heparin level 0.3-0.7 units/ml Monitor platelets by anticoagulation protocol: Yes    Plan:  Give 3500 units bolus x 1 Increase heparin drip rate to 1550 units/hr Check anti-Xa level in 6 hours and daily while on heparin Continue to monitor H&H and platelets    Rowland Lathe, [facility] Clinical Pharmacist 01/27/2019 5:12 PM

## 2019-01-27 NOTE — ED Notes (Signed)
Heparin verified by Jenna, RN. 

## 2019-01-28 ENCOUNTER — Encounter: Payer: Self-pay | Admitting: *Deleted

## 2019-01-28 ENCOUNTER — Encounter
Admission: EM | Disposition: A | Discharge status: Home / Self Care | Payer: Self-pay | Source: Home / Self Care | Attending: Emergency Medicine

## 2019-01-28 HISTORY — PX: LEFT HEART CATH AND CORONARY ANGIOGRAPHY: CATH118249

## 2019-01-28 LAB — CBC
HCT: 36.3 % — ABNORMAL LOW (ref 39.0–52.0)
Hemoglobin: 11.3 g/dL — ABNORMAL LOW (ref 13.0–17.0)
MCH: 26.5 pg (ref 26.0–34.0)
MCHC: 31.1 g/dL (ref 30.0–36.0)
MCV: 85 fL (ref 80.0–100.0)
Platelets: 387 10*3/uL (ref 150–400)
RBC: 4.27 MIL/uL (ref 4.22–5.81)
RDW: 14.7 % (ref 11.5–15.5)
WBC: 9.1 10*3/uL (ref 4.0–10.5)
nRBC: 0 % (ref 0.0–0.2)

## 2019-01-28 LAB — LIPID PANEL
Cholesterol: 214 mg/dL — ABNORMAL HIGH (ref 0–200)
HDL: 38 mg/dL — ABNORMAL LOW (ref >40)
LDL Cholesterol: 140 mg/dL — ABNORMAL HIGH (ref 0–99)
Total CHOL/HDL Ratio: 5.6 RATIO
Triglycerides: 181 mg/dL — ABNORMAL HIGH (ref <150)
VLDL: 36 mg/dL (ref 0–40)

## 2019-01-28 LAB — HEPARIN LEVEL (UNFRACTIONATED): Heparin Unfractionated: 0.42 IU/mL (ref 0.30–0.70)

## 2019-01-28 SURGERY — LEFT HEART CATH AND CORONARY ANGIOGRAPHY
Anesthesia: Moderate Sedation | Laterality: Right

## 2019-01-28 MED ORDER — HYDRALAZINE HCL 20 MG/ML IJ SOLN: 10.0000 mg | INTRAMUSCULAR | Status: AC | PRN

## 2019-01-28 MED ORDER — MIDAZOLAM HCL 2 MG/2ML IJ SOLN
INTRAMUSCULAR | Status: AC
  Filled 2019-01-28: qty 2

## 2019-01-28 MED ORDER — FENTANYL CITRATE (PF) 100 MCG/2ML IJ SOLN
INTRAMUSCULAR | Status: AC
  Filled 2019-01-28: qty 2

## 2019-01-28 MED ORDER — HEPARIN (PORCINE) IN NACL 1000-0.9 UT/500ML-% IV SOLN
INTRAVENOUS | Status: DC | PRN
  Administered 2019-01-28: 500 mL

## 2019-01-28 MED ORDER — VERAPAMIL HCL 2.5 MG/ML IV SOLN
INTRAVENOUS | Status: DC | PRN
  Administered 2019-01-28: 2.5 mg via INTRA_ARTERIAL

## 2019-01-28 MED ORDER — HEPARIN BOLUS VIA INFUSION
1800.0000 [IU] | Freq: Once | INTRAVENOUS | Status: AC
  Administered 2019-01-28: 1800 [IU] via INTRAVENOUS
  Filled 2019-01-28: qty 1800

## 2019-01-28 MED ORDER — MIDAZOLAM HCL 2 MG/2ML IJ SOLN
INTRAMUSCULAR | Status: DC | PRN
  Administered 2019-01-28: 1 mg via INTRAVENOUS

## 2019-01-28 MED ORDER — IOHEXOL 300 MG/ML  SOLN
INTRAMUSCULAR | Status: DC | PRN
  Administered 2019-01-28: 09:00:00 120 mL via INTRA_ARTERIAL

## 2019-01-28 MED ORDER — VERAPAMIL HCL 2.5 MG/ML IV SOLN
INTRAVENOUS | Status: AC
  Filled 2019-01-28: qty 2

## 2019-01-28 MED ORDER — HEPARIN SODIUM (PORCINE) 1000 UNIT/ML IJ SOLN
INTRAMUSCULAR | Status: AC
  Filled 2019-01-28: qty 1

## 2019-01-28 MED ORDER — ONDANSETRON HCL 4 MG/2ML IJ SOLN: 4.0000 mg | Freq: Four times a day (QID) | INTRAMUSCULAR | Status: DC | PRN

## 2019-01-28 MED ORDER — SODIUM CHLORIDE 0.9% FLUSH: 3.0000 mL | INTRAVENOUS | Status: DC | PRN

## 2019-01-28 MED ORDER — HEPARIN (PORCINE) IN NACL 1000-0.9 UT/500ML-% IV SOLN
INTRAVENOUS | Status: AC
  Filled 2019-01-28: qty 1000

## 2019-01-28 MED ORDER — ASPIRIN 81 MG PO TABS: 162.0000 mg | ORAL_TABLET | Freq: Every day | ORAL | Status: DC

## 2019-01-28 MED ORDER — HEPARIN SODIUM (PORCINE) 1000 UNIT/ML IJ SOLN
INTRAMUSCULAR | Status: DC | PRN
  Administered 2019-01-28: 6000 [IU] via INTRAVENOUS

## 2019-01-28 MED ORDER — LABETALOL HCL 5 MG/ML IV SOLN: 10.0000 mg | INTRAVENOUS | Status: AC | PRN

## 2019-01-28 MED ORDER — SODIUM CHLORIDE 0.9 % IV SOLN: 250.0000 mL | INTRAVENOUS | Status: DC | PRN

## 2019-01-28 MED ORDER — FENTANYL CITRATE (PF) 100 MCG/2ML IJ SOLN
INTRAMUSCULAR | Status: DC | PRN
  Administered 2019-01-28: 50 ug via INTRAVENOUS

## 2019-01-28 MED ORDER — ACETAMINOPHEN 325 MG PO TABS: 650.0000 mg | ORAL_TABLET | ORAL | Status: DC | PRN

## 2019-01-28 MED ORDER — SODIUM CHLORIDE 0.9 % WEIGHT BASED INFUSION
1.0000 mL/kg/h | INTRAVENOUS | Status: DC
  Administered 2019-01-28: 1 mL/kg/h via INTRAVENOUS

## 2019-01-28 MED ORDER — SODIUM CHLORIDE 0.9% FLUSH: 3.0000 mL | Freq: Two times a day (BID) | INTRAVENOUS | Status: DC

## 2019-01-28 SURGICAL SUPPLY — 10 items
CATH 5F 110X4 TIG (CATHETERS) ×3 IMPLANT
CATH INFINITI 5 FR 3DRC (CATHETERS) ×3 IMPLANT
CATH INFINITI 5 FR JL3.5 (CATHETERS) ×3 IMPLANT
CATH INFINITI 5FR JL4 (CATHETERS) ×3 IMPLANT
CATH INFINITI JR4 5F (CATHETERS) ×3 IMPLANT
DEVICE RAD TR BAND REGULAR (VASCULAR PRODUCTS) ×3 IMPLANT
GLIDESHEATH SLEND SS 6F .021 (SHEATH) ×3 IMPLANT
KIT MANI 3VAL PERCEP (MISCELLANEOUS) ×3 IMPLANT
PACK CARDIAC CATH (CUSTOM PROCEDURE TRAY) ×3 IMPLANT
WIRE ROSEN-J .035X260CM (WIRE) ×3 IMPLANT

## 2019-01-28 NOTE — Consult Note (Signed)
[place] for heparin infusion  Indication: chest pain/ACS  Allergies  Allergen Reactions  . Codeine     rash  . Compazine [Prochlorperazine]     rash  . Keflex [Cephalexin]     hives  . Penicillins     rash   Patient Measurements: Height: 6\' 3"  (190.5 cm) Weight: (!) 321 lb 12.8 oz (146 kg) IBW/kg (Calculated) : 84.5  Heparin dosing weight: 118 kg  Vital Signs: Temp: 98.7 F (37.1 C) (06/28 1933) Temp Source: Oral (06/28 1933) BP: 121/71 (06/28 1933) Pulse Rate: 72 (06/28 1933)  Labs: Recent Labs    01/26/19 1908  01/27/19 0116 01/27/19 0425 01/27/19 1610 01/27/19 0954 01/27/19 0956 01/27/19 1651 01/27/19 2330  HGB 10.8*  --   --   --   --  10.7*  --   --   --   HCT 35.2*  --   --   --   --  35.3*  --   --   --   PLT 372  --   --   --   --  354  --   --   --   APTT  --   --  28  --   --   --   --   --   --   LABPROT  --   --  14.2  --   --   --   --   --   --   INR  --   --  1.1  --   --   --   --   --   --   HEPARINUNFRC  --   --   --   --   --   --  <0.10* 0.11* 0.23*  CREATININE 1.23  --   --   --   --  0.99  --   --   --   TROPONINIHS 94*   < >  --  177* 161* 119*  --   --   --    < > = values in this interval not displayed.    Estimated Creatinine Clearance: 142.3 mL/min (by C-G formula based on SCr of 0.99 mg/dL).   Medical History: Past Medical History:  Diagnosis Date  . GERD (gastroesophageal reflux disease)   . Hypertension   . Pinched nerve in neck    c4-C5. No limitations of neck movement    Assessment: Pharmacy consulted for heparin infusion dosing and monitoring for 48 yo male for ACS/STEMI.   6/28 ~ 10:00  HL <0.10. 3500 units bolus x 1 given and increased heparin drip rate to 1200 units/hr. Confirmed with nurse there were no heparin interruptions. 6/28 HL @1651  0.11. Subtherapeutic. Will Bolus using 30 units/kg and increase rate by 3 units/kg/hr 6/28 @ 2330 HL 0.23. Level subtherapeutic.     Goal of Therapy:  Heparin level 0.3-0.7 units/ml Monitor platelets by anticoagulation protocol: Yes   Plan:  6/28 @ 2330 HL 0.23. Level subtherapeutic.  Give 1800 units bolus x 1 Increase heparin drip rate to 1800 units/hr Check anti-Xa level in 6 hours and daily while on heparin Continue to monitor H&H and platelets   Pernell Dupre, PharmD, BCPS Clinical Pharmacist 01/28/2019 12:28 AM

## 2019-01-28 NOTE — Discharge Summary (Signed)
[employer] [facility] - [place] at [facility]  Danis L Wiseman, 48 y.o., DOB [birth year], MRN [id number]. Admission date: 01/26/2019 Discharge Date 01/28/2019 Primary MD Danella PentonMiller, Mark F, MD Admitting Physician Oralia Manisavid Willis, MD  Admission Diagnosis  NSTEMI (non-ST elevated myocardial infarction) [facility](HCC) [I21.4]  Discharge Diagnosis   Principal Problem: Noncardiac chest pain HTN (hypertension) GERD (gastroesophageal reflux disease) Anxiety Hyperlipidemia        Hospital Course Levi Sheppard  is a 48 y.o. male who presents with chief complaint as above.  Presents the ED after an acute episode of chest pain this afternoon.  He states that he was outside pressure washing for about 5 hours when he developed central chest discomfort which radiated to his left arm and was accompanied by diaphoresis, nausea, lightheadedness, and shortness of breath.    Patient had a abnormal troponin levels therefore he was admitted to the hospital was seen by cardiology and underwent cardiac cath.  The cardiac catheterization was normal.             Consults  cardiology  Significant Tests:  See full reports for all details     Dg Chest 2 View  Result Date: 01/26/2019 CLINICAL DATA:  Chest pain EXAM: CHEST - 2 VIEW COMPARISON:  July 20, 2010 FINDINGS: The heart size is borderline enlarged. There is no pneumothorax. No large pleural effusion. No significant area of consolidation. There is no acute osseous abnormality. The patient appears to be status post prior ACDF of the lower cervical spine. There is a calcified structure projecting over the mediastinum which may represent a calcified lymph node. IMPRESSION: No active cardiopulmonary disease. Electronically Signed   By: Katherine Mantlehristopher  Green M.D.   On: 01/26/2019 19:21       Today   Subjective:   Levi CurdSamuel Deltoro no further chest pain Objective:   Blood pressure 136/82, pulse 60, temperature 97.9 F (36.6 C), temperature source Oral,  resp. rate 16, height 6\' 3"  (1.905 m), weight (!) 146 kg, SpO2 98 %.  .  Intake/Output Summary (Last 24 hours) at 01/28/2019 1411 Last data filed at 01/28/2019 0644 Gross per 24 hour  Intake 445.41 ml  Output 3775 ml  Net -3329.59 ml    Exam VITAL SIGNS: Blood pressure 136/82, pulse 60, temperature 97.9 F (36.6 C), temperature source Oral, resp. rate 16, height 6\' 3"  (1.905 m), weight (!) 146 kg, SpO2 98 %.  GENERAL:  48 y.o.-year-old patient lying in the bed with no acute distress.  EYES: Pupils equal, round, reactive to light and accommodation. No scleral icterus. Extraocular muscles intact.  HEENT: Head atraumatic, normocephalic. Oropharynx and nasopharynx clear.  NECK:  Supple, no jugular venous distention. No thyroid enlargement, no tenderness.  LUNGS: Normal breath sounds bilaterally, no wheezing, rales,rhonchi or crepitation. No use of accessory muscles of respiration.  CARDIOVASCULAR: S1, S2 normal. No murmurs, rubs, or gallops.  ABDOMEN: Soft, nontender, nondistended. Bowel sounds present. No organomegaly or mass.  EXTREMITIES: No pedal edema, cyanosis, or clubbing.  NEUROLOGIC: Cranial nerves II through XII are intact. Muscle strength 5/5 in all extremities. Sensation intact. Gait not checked.  PSYCHIATRIC: The patient is alert and oriented x 3.  SKIN: No obvious rash, lesion, or ulcer.   Data Review     CBC w Diff:  Lab Results  Component Value Date   WBC 9.1 01/28/2019   HGB 11.3 (L) 01/28/2019   HCT 36.3 (L) 01/28/2019   PLT 387 01/28/2019   CMP:  Lab Results  Component Value Date  NA 140 01/27/2019   K 3.8 01/27/2019   CL 104 01/27/2019   CO2 26 01/27/2019   BUN 16 01/27/2019   CREATININE 0.99 01/27/2019   PROT 7.2 01/26/2019   ALBUMIN 4.1 01/26/2019   BILITOT 0.6 01/26/2019   ALKPHOS 57 01/26/2019   AST 30 01/26/2019   ALT 35 01/26/2019  .  Micro Results Recent Results (from the past 240 hour(s))  SARS Coronavirus 2 (CEPHEID - Performed in [place] hospital lab), Hosp Order     Status: None   Collection Time: 01/27/19  9:14 AM   Specimen: Nasopharyngeal Swab  Result Value Ref Range Status   SARS Coronavirus 2 NEGATIVE NEGATIVE Final    Comment: (NOTE) If result is NEGATIVE SARS-CoV-2 target nucleic acids are NOT DETECTED. The SARS-CoV-2 RNA is generally detectable in upper and lower  respiratory specimens during the acute phase of infection. The lowest  concentration of SARS-CoV-2 viral copies this assay can detect is 250  copies / mL. A negative result does not preclude SARS-CoV-2 infection  and should not be used as the sole basis for treatment or other  patient management decisions.  A negative result may occur with  improper specimen collection / handling, submission of specimen other  than nasopharyngeal swab, presence of viral mutation(s) within the  areas targeted by this assay, and inadequate number of viral copies  (<250 copies / mL). A negative result must be combined with clinical  observations, patient history, and epidemiological information. If result is POSITIVE SARS-CoV-2 target nucleic acids are DETECTED. The SARS-CoV-2 RNA is generally detectable in upper and lower  respiratory specimens dur ing the acute phase of infection.  Positive  results are indicative of active infection with SARS-CoV-2.  Clinical  correlation with patient history and other diagnostic information is  necessary to determine patient infection status.  Positive results do  not rule out bacterial infection or co-infection with other viruses. If result is PRESUMPTIVE POSTIVE SARS-CoV-2 nucleic acids MAY BE PRESENT.   A presumptive positive result was obtained on the submitted specimen  and confirmed on repeat testing.  While 2019 novel coronavirus  (SARS-CoV-2) nucleic acids may be present in the submitted sample  additional confirmatory testing may be necessary for epidemiological  and / or clinical management purposes  to  differentiate between  SARS-CoV-2 and other Sarbecovirus currently known to infect humans.  If clinically indicated additional testing with an alternate test  methodology [phone]) is advised. The SARS-CoV-2 RNA is generally  detectable in upper and lower respiratory sp ecimens during the acute  phase of infection. The expected result is Negative. Fact Sheet for Patients:  StrictlyIdeas.no Fact Sheet for Healthcare Providers: BankingDealers.co.za This test is not yet approved or cleared by the [place] FDA and has been authorized for detection and/or diagnosis of SARS-CoV-2 by FDA under an Emergency Use Authorization (EUA).  This EUA will remain in effect (meaning this test can be used) for the duration of the COVID-19 declaration under Section 564(b)(1) of the Act, 21 U.S.C. section 360bbb-3(b)(1), unless the authorization is terminated or revoked sooner. Performed at [facility], [address], [place], [place] [address]         Code Status Orders  (From admission, onward)         Start     Ordered   01/27/19 0401  Full code  Continuous     01/27/19 0400        Code Status History    This patient  has a current code status but no historical code status.   Advance Care Planning Activity    Advance Directive Documentation     Most Recent Value  Type of Advance Directive  Healthcare Power of Attorney, Living will  Pre-existing out of facility DNR order (yellow form or pink MOST form)  -  "MOST" Form in Place?  -          Follow-up Information    Danella PentonMiller, Mark F, MD. Go on 01/31/2019.   Specialty: Internal Medicine Why: appointment at 10:15am Contact information: [address] [facility] [place] [address] [phone]        Laurier NancyKhan, Shaukat A, MD. Nyra CapesGo on 01/29/2019.   Specialty: Cardiology Why: appointment at 10:30am Contact information: [address] [place] [address] [phone]           Discharge Medications   Allergies as of 01/28/2019      Reactions   Codeine    rash   Compazine [prochlorperazine]    rash   Keflex [cephalexin]    hives   Penicillins    rash      Medication List    STOP taking these medications   doxycycline 20 MG tablet Commonly known as: PERIOSTAT     TAKE these medications   ALPRAZolam 0.25 MG tablet Commonly known as: XANAX Take 0.25 mg by mouth at bedtime as needed for anxiety.   aspirin 325 MG EC tablet Take 325 mg by mouth daily. What changed: Another medication with the same name was removed. Continue taking this medication, and follow the directions you see here. Notes to patient: Next dose 6/30 am   DULoxetine 60 MG capsule Commonly known as: CYMBALTA Take 60 mg by mouth daily. AM Notes to patient: Next dose 6/30 am   gabapentin 300 MG capsule Commonly known as: NEURONTIN Take 300 mg by mouth daily. Notes to patient: Next dose 6/30   Melatonin 10 MG Caps Take 20 mg by mouth at bedtime. Notes to patient: Next dose 6/29 pm   meloxicam 7.5 MG tablet Commonly known as: MOBIC Take 7.5 mg by mouth daily. Notes to patient: Take as before   Multi-Vitamin tablet Take 1 tablet by mouth daily. Notes to patient: Next dose 6/30 am   olmesartan-hydrochlorothiazide 40-12.5 MG tablet Commonly known as: BENICAR HCT Take 1 tablet by mouth daily. Notes to patient: None received today,Take as before   omeprazole 20 MG capsule Commonly known as: PRILOSEC Take 20 mg by mouth daily. AM   zolpidem 10 MG tablet Commonly known as: AMBIEN Take 10 mg by mouth at bedtime as needed for sleep.          Total Time in preparing paper work, data evaluation and todays exam - 35 minutes  Auburn BilberryShreyang Tamantha Saline M.D on 01/28/2019 at 2:11 PM [employer] [facility]   Office  [phone]

## 2019-01-28 NOTE — Progress Notes (Signed)
SUBJECTIVE: doing well   Vitals:   01/27/19 1933 01/28/19 0333 01/28/19 0715 01/28/19 0855  BP: 121/71 111/64 (!) 153/88 (!) 142/82  Pulse: 72 68 63   Resp:   14   Temp: 98.7 F (37.1 C) 98.4 F (36.9 C) (!) 97.3 F (36.3 C)   TempSrc: Oral Oral Oral   SpO2: 99% 97% 96%   Weight:   (!) 146 kg   Height:   6\' 3"  (1.905 m)     Intake/Output Summary (Last 24 hours) at 01/28/2019 0906 Last data filed at 01/28/2019 0644 Gross per 24 hour  Intake 445.41 ml  Output 4475 ml  Net -4029.59 ml    LABS: Basic Metabolic Panel: Recent Labs    01/26/19 1908 01/27/19 0954  NA 136 140  K 4.0 3.8  CL 100 104  CO2 25 26  GLUCOSE 94 106*  BUN 19 16  CREATININE 1.23 0.99  CALCIUM 9.1 9.1   Liver Function Tests: Recent Labs    01/26/19 1908  AST 30  ALT 35  ALKPHOS 57  BILITOT 0.6  PROT 7.2  ALBUMIN 4.1   No results for input(s): LIPASE, AMYLASE in the last 72 hours. CBC: Recent Labs    01/27/19 0954 01/28/19 0650  WBC 8.5 9.1  HGB 10.7* 11.3*  HCT 35.3* 36.3*  MCV 86.1 85.0  PLT 354 387   Cardiac Enzymes: No results for input(s): CKTOTAL, CKMB, CKMBINDEX, TROPONINI in the last 72 hours. BNP: Invalid input(s): POCBNP D-Dimer: No results for input(s): DDIMER in the last 72 hours. Hemoglobin A1C: No results for input(s): HGBA1C in the last 72 hours. Fasting Lipid Panel: Recent Labs    01/28/19 0650  CHOL 214*  HDL 38*  LDLCALC 140*  TRIG 181*  CHOLHDL 5.6   Thyroid Function Tests: No results for input(s): TSH, T4TOTAL, T3FREE, THYROIDAB in the last 72 hours.  Invalid input(s): FREET3 Anemia Panel: No results for input(s): VITAMINB12, FOLATE, FERRITIN, TIBC, IRON, RETICCTPCT in the last 72 hours.   PHYSICAL EXAM General: Well developed, well nourished, in no acute distress HEENT:  Normocephalic and atramatic Neck:  No JVD.  Lungs: Clear bilaterally to auscultation and percussion. Heart: HRRR . Normal S1 and S2 without gallops or murmurs.  Abdomen:  Bowel sounds are positive, abdomen soft and non-tender  Msk:  Back normal, normal gait. Normal strength and tone for age. Extremities: No clubbing, cyanosis or edema.   Neuro: Alert and oriented X 3. Psych:  Good affect, responds appropriately  TELEMETRY: nsr  ASSESSMENT AND PLAN: non cardiac chest pain, normal coronaries with normal EF, may go home with f/u tomorrow 10 am  Principal Problem:   Chest pain Active Problems:   HTN (hypertension)   GERD (gastroesophageal reflux disease)   Anxiety    KHAN,SHAUKAT A, MD, [facility] 01/28/2019 9:06 AM

## 2019-01-28 NOTE — Progress Notes (Signed)
Levi Sheppard at [place] was admitted to the Hospital on 01/26/2019 and Discharged  01/28/2019 and should be excused from work/school   for 4 days starting 01/26/2019 , may return to work/school without any restrictions.  Call Dustin Flock MD with questions.  Dustin Flock M.D on 01/28/2019,at 11:46 AM  [name] at [facility]  [phone]

## 2019-01-28 NOTE — Plan of Care (Signed)
  Problem: Clinical Measurements: Goal: Respiratory complications will improve Outcome: Progressing Note: On room air   Problem: Activity: Goal: Risk for activity intolerance will decrease Outcome: Progressing Note: Steady gait, independent in room, but using urinal at bedside   Problem: Elimination: Goal: Will not experience complications related to urinary retention Outcome: Progressing   Problem: Pain Managment: Goal: General experience of comfort will improve Outcome: Progressing Note: No complaints of pain this shift   Problem: Safety: Goal: Ability to remain free from injury will improve Outcome: Progressing   Problem: Skin Integrity: Goal: Risk for impaired skin integrity will decrease Outcome: Progressing   Problem: Education: Goal: Knowledge of General Education information will improve Description: Including pain rating scale, medication(s)/side effects and non-pharmacologic comfort measures Outcome: Completed/Met   Problem: Nutrition: Goal: Adequate nutrition will be maintained Outcome: Completed/Met   Problem: Coping: Goal: Level of anxiety will decrease Outcome: Completed/Met

## 2019-01-28 NOTE — Progress Notes (Signed)
Patient discharged to home.  Tele and IV d/c'd prior to discharge.  Patient verbalized understanding of discharge instructions and after care of radial site care.

## 2019-01-28 NOTE — Progress Notes (Signed)
Patient returned from cath lab s/p R radial cath.  Resting comfortably at this time. No complaints. R wrist without hematoma.  Dressing dry and intact. Plan is for patient to go home today.

## 2019-01-28 NOTE — Consult Note (Signed)
[place] for heparin infusion  Indication: chest pain/ACS  Allergies  Allergen Reactions  . Codeine     rash  . Compazine [Prochlorperazine]     rash  . Keflex [Cephalexin]     hives  . Penicillins     rash   Patient Measurements: Height: 6\' 3"  (190.5 cm) Weight: (!) 321 lb 12.8 oz (146 kg) IBW/kg (Calculated) : 84.5  Heparin dosing weight: 118 kg  Vital Signs: Temp: 97.3 F (36.3 C) (06/29 0715) Temp Source: Oral (06/29 0715) BP: 153/88 (06/29 0715) Pulse Rate: 63 (06/29 0715)  Labs: Recent Labs    01/26/19 1908  01/27/19 0116 01/27/19 0425 01/27/19 1601 01/27/19 0954  01/27/19 1651 01/27/19 2330 01/28/19 0650  HGB 10.8*  --   --   --   --  10.7*  --   --   --  11.3*  HCT 35.2*  --   --   --   --  35.3*  --   --   --  36.3*  PLT 372  --   --   --   --  354  --   --   --  387  APTT  --   --  28  --   --   --   --   --   --   --   LABPROT  --   --  14.2  --   --   --   --   --   --   --   INR  --   --  1.1  --   --   --   --   --   --   --   HEPARINUNFRC  --   --   --   --   --   --    < > 0.11* 0.23* 0.42  CREATININE 1.23  --   --   --   --  0.99  --   --   --   --   TROPONINIHS 94*   < >  --  177* 161* 119*  --   --   --   --    < > = values in this interval not displayed.    Estimated Creatinine Clearance: 142.3 mL/min (by C-G formula based on SCr of 0.99 mg/dL).   Medical History: Past Medical History:  Diagnosis Date  . GERD (gastroesophageal reflux disease)   . Hypertension   . Pinched nerve in neck    c4-C5. No limitations of neck movement    Assessment: Pharmacy consulted for heparin infusion dosing and monitoring for 48 yo male for ACS/STEMI.   6/28 ~ 10:00  HL <0.10. 3500 units bolus x 1 given and increased heparin drip rate to 1200 units/hr. Confirmed with nurse there were no heparin interruptions. 6/28 HL @1651  0.11. Subtherapeutic. Will Bolus using 30 units/kg and increase rate by 3 units/kg/hr 6/28  @ 2330 HL 0.23. 1800 units bolus x . Increase heparin drip rate to 1800 units/hr 6/29 @ 0650 HL 0.42. Level therapeutic.    Goal of Therapy:  Heparin level 0.3-0.7 units/ml Monitor platelets by anticoagulation protocol: Yes   Plan:  Heparin level subtherapeutic.  Continue current heparin drip rate at 1800 units/hr Check anti-Xa level in 6 hours and daily while on heparin Continue to monitor H&H and platelets   Oswald Hillock, PharmD, BCPS Clinical Pharmacist 01/28/2019 7:45 AM

## 2019-01-29 LAB — HIV ANTIBODY (ROUTINE TESTING W REFLEX): HIV Screen 4th Generation wRfx: NONREACTIVE

## 2019-01-31 ENCOUNTER — Other Ambulatory Visit: Payer: Self-pay | Admitting: Internal Medicine

## 2019-01-31 ENCOUNTER — Ambulatory Visit
Admission: RE | Admit: 2019-01-31 | Discharge: 2019-01-31 | Disposition: A | Discharge status: Home / Self Care | Payer: Managed Care, Other (non HMO) | Source: Ambulatory Visit | Attending: Internal Medicine | Admitting: Internal Medicine

## 2019-01-31 ENCOUNTER — Other Ambulatory Visit: Payer: Self-pay

## 2019-01-31 DIAGNOSIS — R0609 Other forms of dyspnea: Secondary | ICD-10-CM

## 2019-01-31 DIAGNOSIS — R0789 Other chest pain: Secondary | ICD-10-CM | POA: Diagnosis present

## 2019-01-31 MED ORDER — IOHEXOL 350 MG/ML SOLN
75.0000 mL | Freq: Once | INTRAVENOUS | Status: AC | PRN
  Administered 2019-01-31: 75 mL via INTRAVENOUS

## 2019-07-11 ENCOUNTER — Other Ambulatory Visit (HOSPITAL_COMMUNITY): Payer: Self-pay | Admitting: Internal Medicine

## 2019-07-11 ENCOUNTER — Other Ambulatory Visit: Payer: Self-pay | Admitting: Internal Medicine

## 2019-07-11 ENCOUNTER — Ambulatory Visit
Admission: RE | Admit: 2019-07-11 | Discharge: 2019-07-11 | Disposition: A | Discharge status: Home / Self Care | Payer: Managed Care, Other (non HMO) | Source: Ambulatory Visit | Attending: Internal Medicine | Admitting: Internal Medicine

## 2019-07-11 ENCOUNTER — Other Ambulatory Visit: Payer: Self-pay

## 2019-07-11 DIAGNOSIS — R1030 Lower abdominal pain, unspecified: Secondary | ICD-10-CM | POA: Diagnosis present

## 2019-07-11 LAB — POCT I-STAT CREATININE: Creatinine, Ser: 0.7 mg/dL (ref 0.61–1.24)

## 2019-07-11 MED ORDER — IOHEXOL 300 MG/ML  SOLN
100.0000 mL | Freq: Once | INTRAMUSCULAR | Status: AC | PRN
  Administered 2019-07-11: 100 mL via INTRAVENOUS

## 2019-10-26 IMAGING — CT CT ANGIOGRAPHY CHEST
2 of 7 series · 19 of 46 positions shown · IV contrast (APPLIED)
Comparison: July 21, 2010

CLINICAL DATA: Atypical chest pain.  Dyspnea on exertion.

EXAM:
CT ANGIOGRAPHY CHEST WITH CONTRAST
TECHNIQUE: Multidetector CT imaging of the chest was performed using the
standard protocol during bolus administration of intravenous
contrast. Multiplanar CT image reconstructions and MIPs were
obtained to evaluate the vascular anatomy.
CONTRAST:  75mL OMNIPAQUE IOHEXOL 350 MG/ML SOLN

[Series 5: thins · axial · 0.70mm/px · z∈[-361,-105]mm · 17 of 289 slices shown]
[im 17/289  lung]
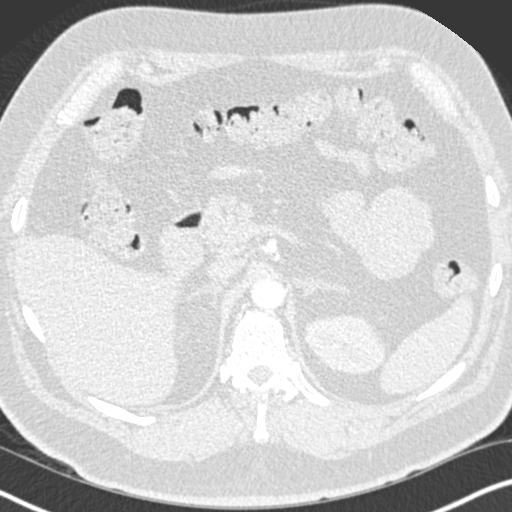
[im 33/289  soft-tissue]
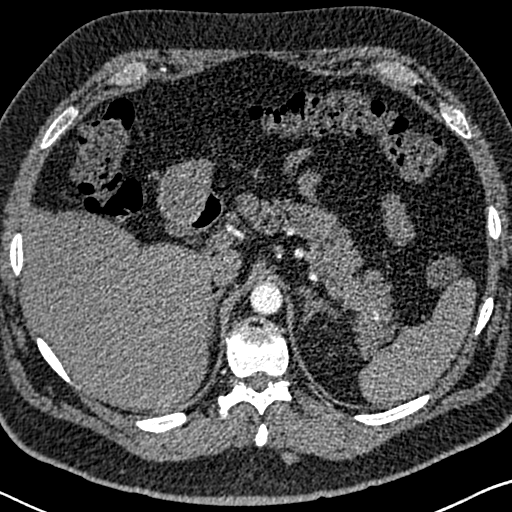
[im 49/289  lung]
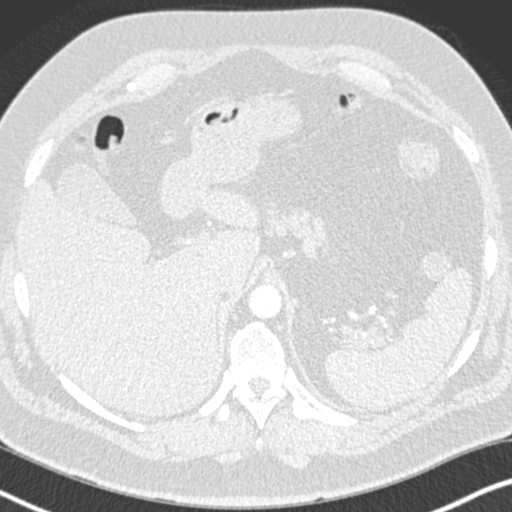
[im 65/289  soft-tissue]
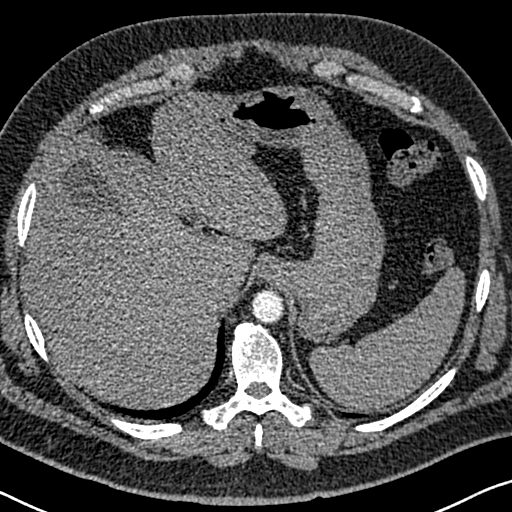
[im 81/289  lung]
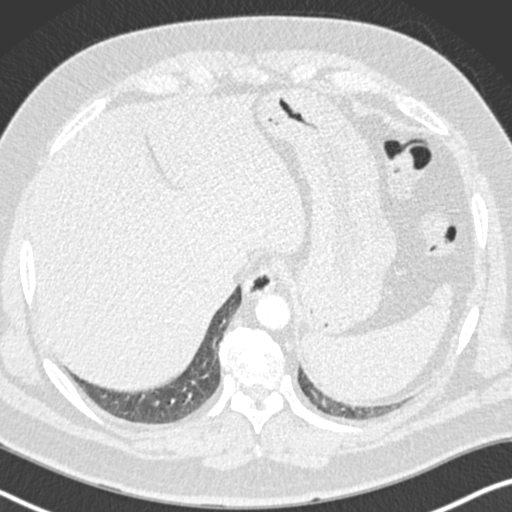
[im 97/289  soft-tissue]
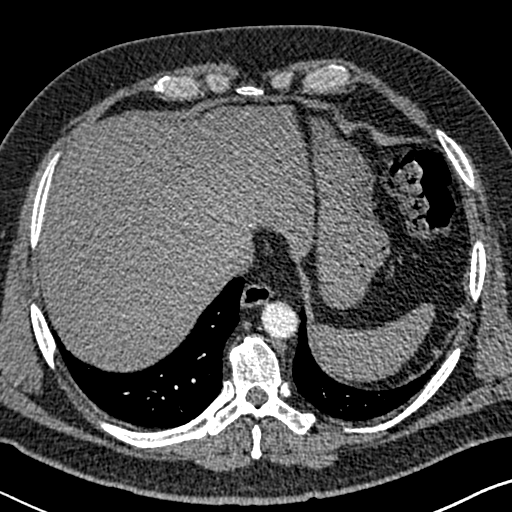
[im 113/289  lung]
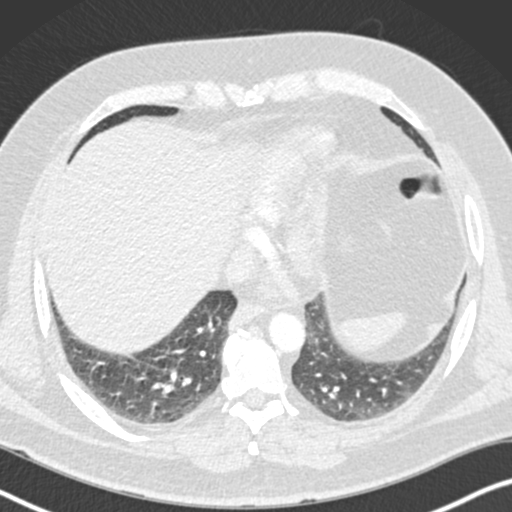
[im 129/289  soft-tissue]
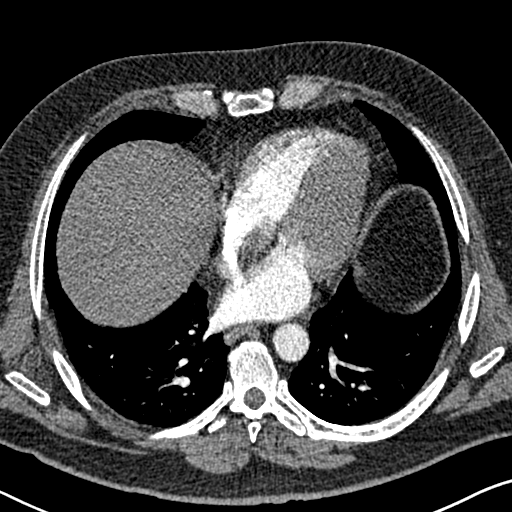
[im 145/289  lung]
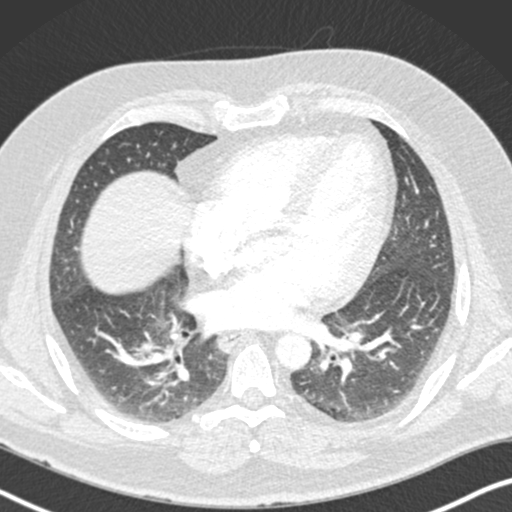
[im 161/289  soft-tissue]
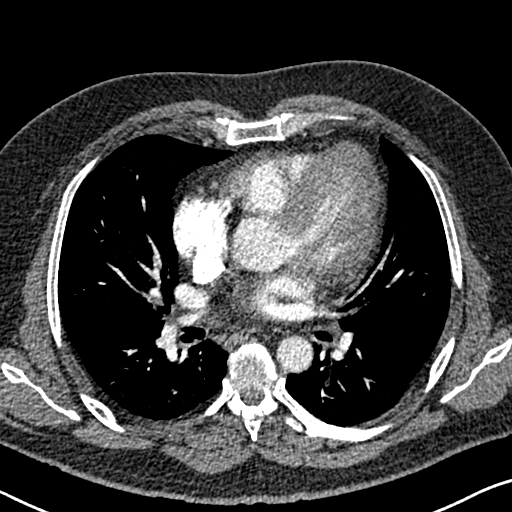
[im 177/289  lung]
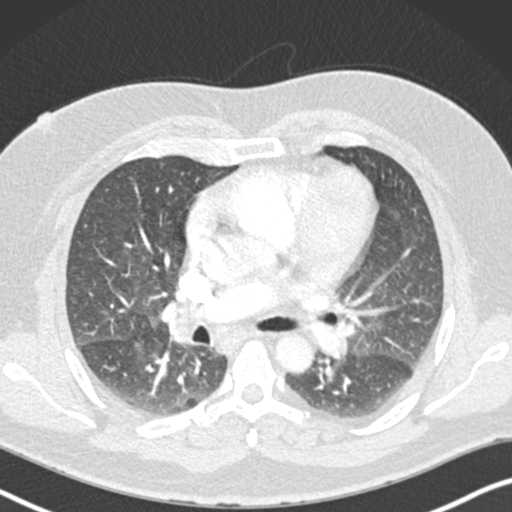
[im 193/289  soft-tissue]
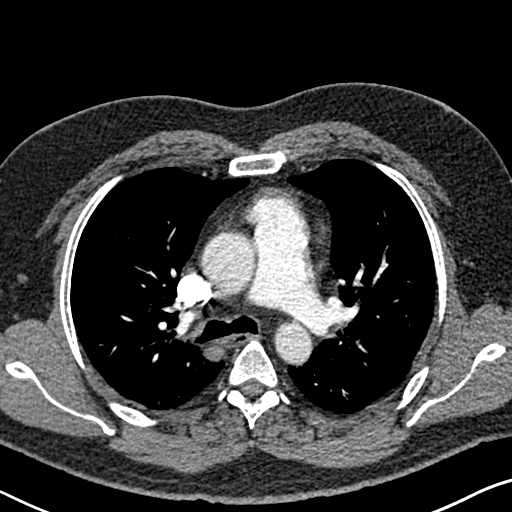
[im 209/289  lung]
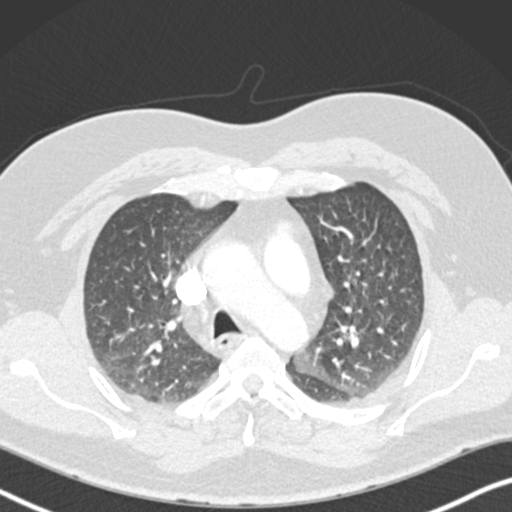
[im 225/289  soft-tissue]
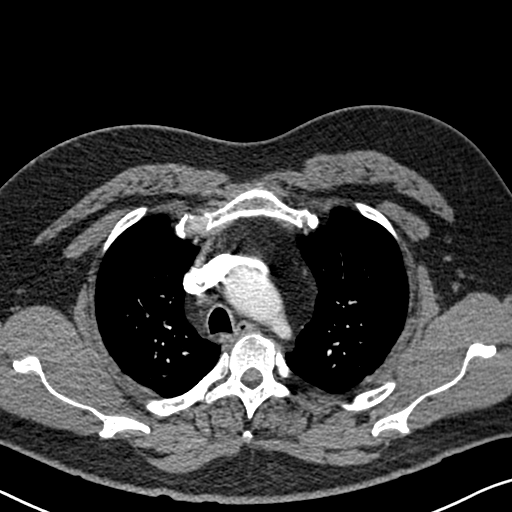
[im 241/289  lung]
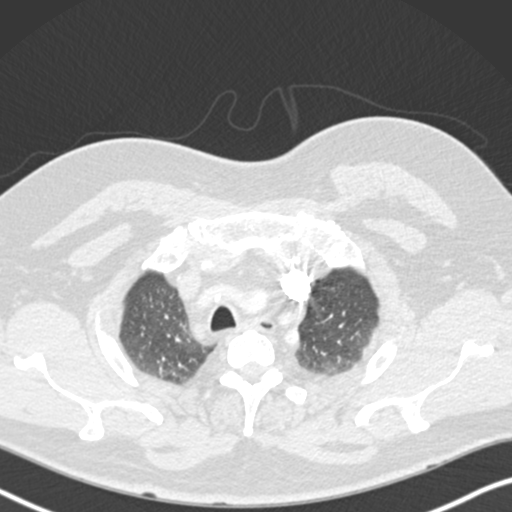
[im 257/289  soft-tissue]
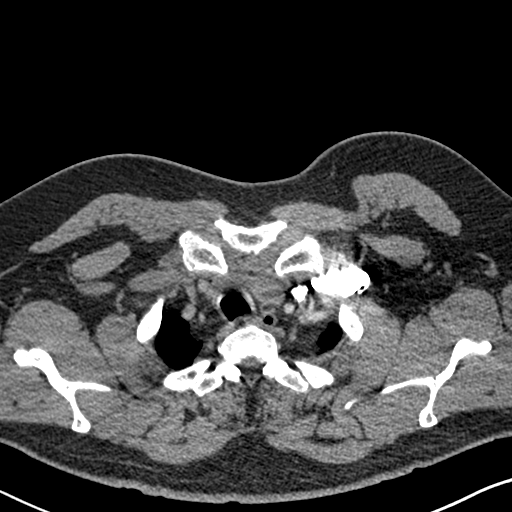
[im 273/289  lung]
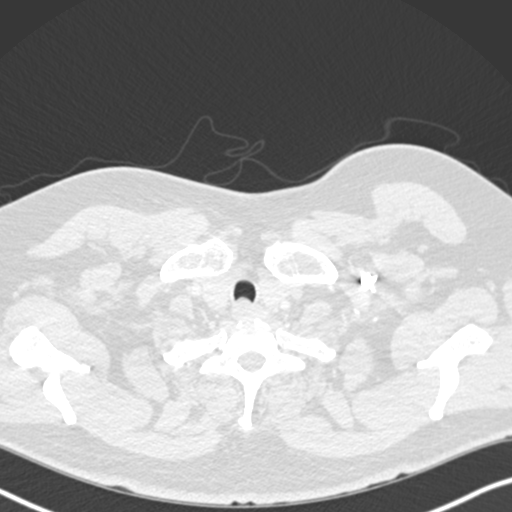

[Series 8: coronal mpr · coronal · 0.62mm/px · 2 of 104 slices shown]
[im 35/104  soft-tissue]
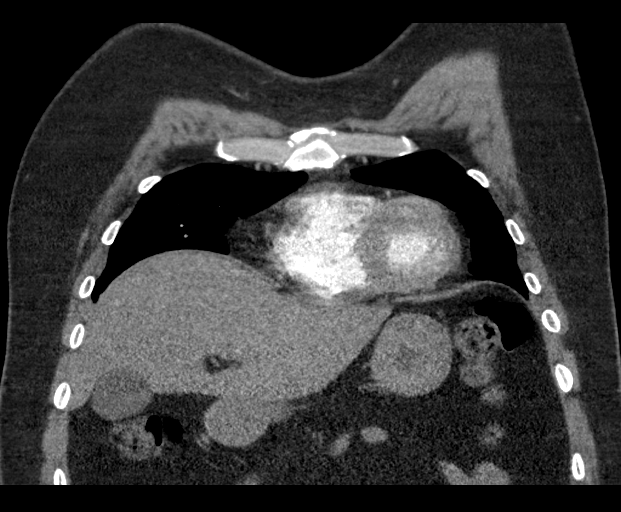
[im 69/104  soft-tissue]
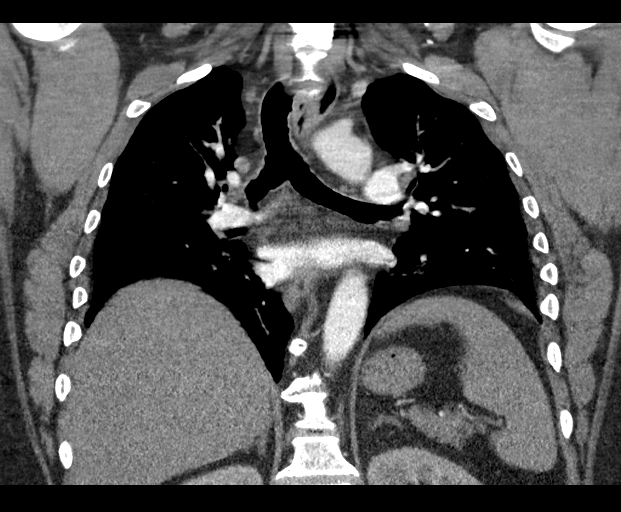

[19 of 46 positions shown; findings below may reference images not displayed]

FINDINGS: Cardiovascular: Satisfactory opacification of the pulmonary arteries
to the segmental level. No evidence of pulmonary embolism. Normal
heart size. No pericardial effusion.

Mediastinum/Nodes: No enlarged mediastinal, hilar, or axillary lymph
nodes. Thyroid gland, trachea, and esophagus demonstrate no
significant findings.

Lungs/Pleura: There is no focal pneumonia. No pleural effusion or
pneumothorax. Minimal atelectasis of posterior lung bases are noted.

Upper Abdomen: Diffuse low density of the liver is identified.
Minimal hiatal hernia is noted. The other visualized upper abdominal
structures are unremarkable.

Musculoskeletal: Degenerative joint changes of spine are noted.

Review of the MIP images confirms the above findings.
IMPRESSION: No pulmonary embolus.

No acute abnormality identified in the chest.

## 2024-02-22 ENCOUNTER — Encounter: Payer: Self-pay | Admitting: Internal Medicine

## 2024-03-13 ENCOUNTER — Encounter: Payer: Self-pay | Admitting: Internal Medicine

## 2024-03-13 ENCOUNTER — Ambulatory Visit
Admission: RE | Admit: 2024-03-13 | Discharge: 2024-03-13 | Disposition: A | Discharge status: Home / Self Care | Attending: Internal Medicine | Admitting: Internal Medicine

## 2024-03-13 ENCOUNTER — Ambulatory Visit: Admitting: Anesthesiology

## 2024-03-13 ENCOUNTER — Encounter
Admission: RE | Disposition: A | Discharge status: Home / Self Care | Payer: Self-pay | Source: Home / Self Care | Attending: Internal Medicine

## 2024-03-13 DIAGNOSIS — I10 Essential (primary) hypertension: Secondary | ICD-10-CM | POA: Diagnosis not present

## 2024-03-13 DIAGNOSIS — Z981 Arthrodesis status: Secondary | ICD-10-CM | POA: Insufficient documentation

## 2024-03-13 DIAGNOSIS — Z6841 Body Mass Index (BMI) 40.0 and over, adult: Secondary | ICD-10-CM | POA: Insufficient documentation

## 2024-03-13 DIAGNOSIS — K2289 Other specified disease of esophagus: Secondary | ICD-10-CM | POA: Diagnosis not present

## 2024-03-13 DIAGNOSIS — K2970 Gastritis, unspecified, without bleeding: Secondary | ICD-10-CM | POA: Diagnosis not present

## 2024-03-13 DIAGNOSIS — R1310 Dysphagia, unspecified: Secondary | ICD-10-CM | POA: Diagnosis present

## 2024-03-13 DIAGNOSIS — F17210 Nicotine dependence, cigarettes, uncomplicated: Secondary | ICD-10-CM | POA: Diagnosis not present

## 2024-03-13 DIAGNOSIS — R1314 Dysphagia, pharyngoesophageal phase: Secondary | ICD-10-CM | POA: Diagnosis not present

## 2024-03-13 DIAGNOSIS — E66813 Obesity, class 3: Secondary | ICD-10-CM | POA: Diagnosis not present

## 2024-03-13 DIAGNOSIS — R1313 Dysphagia, pharyngeal phase: Secondary | ICD-10-CM | POA: Insufficient documentation

## 2024-03-13 DIAGNOSIS — K2100 Gastro-esophageal reflux disease with esophagitis, without bleeding: Secondary | ICD-10-CM | POA: Diagnosis not present

## 2024-03-13 DIAGNOSIS — K259 Gastric ulcer, unspecified as acute or chronic, without hemorrhage or perforation: Secondary | ICD-10-CM | POA: Diagnosis not present

## 2024-03-13 DIAGNOSIS — Z79899 Other long term (current) drug therapy: Secondary | ICD-10-CM | POA: Insufficient documentation

## 2024-03-13 HISTORY — PX: ESOPHAGOGASTRODUODENOSCOPY: SHX5428

## 2024-03-13 SURGERY — EGD (ESOPHAGOGASTRODUODENOSCOPY)
Anesthesia: General

## 2024-03-13 MED ORDER — LIDOCAINE HCL (PF) 2 % IJ SOLN
INTRAMUSCULAR | Status: AC
  Filled 2024-03-13: qty 5

## 2024-03-13 MED ORDER — SODIUM CHLORIDE 0.9 % IV SOLN: INTRAVENOUS | Status: DC

## 2024-03-13 MED ORDER — PROPOFOL 10 MG/ML IV BOLUS
INTRAVENOUS | Status: DC | PRN
  Administered 2024-03-13 (×4): 50 mg via INTRAVENOUS

## 2024-03-13 MED ORDER — PROPOFOL 500 MG/50ML IV EMUL
INTRAVENOUS | Status: DC | PRN
  Administered 2024-03-13 (×2): 75 ug/kg/min via INTRAVENOUS

## 2024-03-13 MED ORDER — LIDOCAINE HCL (CARDIAC) PF 100 MG/5ML IV SOSY
PREFILLED_SYRINGE | INTRAVENOUS | Status: DC | PRN
Start: 2024-03-13 — End: 2024-03-13
  Administered 2024-03-13 (×2): 100 mg via INTRAVENOUS

## 2024-03-13 MED ORDER — PROPOFOL 1000 MG/100ML IV EMUL
INTRAVENOUS | Status: AC
  Filled 2024-03-13: qty 100

## 2024-03-13 MED ORDER — GLYCOPYRROLATE 0.2 MG/ML IJ SOLN
INTRAMUSCULAR | Status: DC | PRN
  Administered 2024-03-13 (×2): .2 mg via INTRAVENOUS

## 2024-03-13 MED ORDER — GLYCOPYRROLATE 0.2 MG/ML IJ SOLN
INTRAMUSCULAR | Status: AC
  Filled 2024-03-13: qty 1

## 2024-03-13 NOTE — H&P (Signed)
 Outpatient short stay form Pre-procedure 03/13/2024 12:29 PM [place] K. Aundria, M.D.  Primary Physician: Oneil Pinal, M.D.  Reason for visit:    Diagnoses  Pharyngoesophageal dysphagia  Globus sensation  GERD without esophagitis     History of present illness:  [name] presents to the [facility] GI clinic via video visit for chief complaint of pharyngoesophageal dysphagia. He reports symptoms have been ongoing over the past 20-months. He reports issues swallowing both solids and liquids, 50-50%. He feels like he gets easily strangled on liquids. Foods seem to have a difficult time going down and he reports it doesn't feel like there is enough space in his esophagus for things to go down. He feels like his throat expands before food is able to pass down. Food seems to stick at level right at suprasternal notch or just above it. He has not had to regurgitate the food bolus. No issues initiating a swallow. There is no coughing while swallowing. He denies any issues with pill dysphagia. His neurosurgeon advised him to have a video speech swallow study which was done at [facility]. He had grossly functional oropharyngeal swallowing function. There was no aspiration noted. A GI consult was advised due to concerns of displaced globus sensation and possible hiatal hernia. He has not had worsening GERD symptoms. He continues to take omeprazole 40 mg once and this is working well. He denies any complaints of odynophagia, early satiety, hoarseness, or epigastric abdominal pain. He had a cervical spinal fusion in Oct 2017 and a lumbar spinal fusion in June 2022. He denies any unintentional weight loss. 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 He is doing well with no flare-ups of diarrhea or fecal urgency. He is satisfied with his bowels.      Current Facility-Administered Medications:    0.9 %  sodium chloride  infusion, , Intravenous, Continuous, [place], Davey Limas K, MD, Last Rate: 20  mL/hr at 03/13/24 1217, Continued from Pre-op at 03/13/24 1217  Medications Prior to Admission  Medication Sig Dispense Refill Last Dose/Taking   olmesartan-hydrochlorothiazide  (BENICAR HCT) 40-12.5 MG tablet Take 1 tablet by mouth daily.   03/13/2024   ALPRAZolam  (XANAX ) 0.25 MG tablet Take 0.25 mg by mouth at bedtime as needed for anxiety.      aspirin  325 MG EC tablet Take 325 mg by mouth daily.   03/03/2024   DULoxetine  (CYMBALTA ) 60 MG capsule Take 60 mg by mouth daily. AM      gabapentin (NEURONTIN) 300 MG capsule Take 300 mg by mouth daily.      Melatonin 10 MG CAPS Take 20 mg by mouth at bedtime.      meloxicam (MOBIC) 7.5 MG tablet Take 7.5 mg by mouth daily.      Multiple Vitamin (MULTI-VITAMIN) tablet Take 1 tablet by mouth daily.      omeprazole (PRILOSEC) 20 MG capsule Take 20 mg by mouth daily. AM      zolpidem  (AMBIEN ) 10 MG tablet Take 10 mg by mouth at bedtime as needed for sleep.        Allergies  Allergen Reactions   Codeine     rash   Compazine [Prochlorperazine]     rash   Keflex [Cephalexin]     hives   Penicillins     rash     Past Medical History:  Diagnosis Date   GERD (gastroesophageal reflux disease)    Hypertension    Pinched nerve in neck    c4-C5. No limitations of neck  movement    Review of systems:  Otherwise negative.    Physical Exam  Gen: Alert, oriented. Appears stated age.  HEENT: [place]/AT. PERRLA. Lungs: CTA, no wheezes. CV: RR nl S1, S2. Abd: soft, benign, no masses. BS+ Ext: No edema. Pulses 2+    Planned procedures: Proceed with EGD. The patient understands the nature of the planned procedure, indications, risks, alternatives and potential complications including but not limited to bleeding, infection, perforation, damage to internal organs and possible oversedation/side effects from anesthesia. The patient agrees and gives consent to proceed.  Please refer to procedure notes for findings, recommendations and patient  disposition/instructions.     Aadyn Buchheit K. Aundria, M.D. Gastroenterology 03/13/2024  12:29 PM

## 2024-03-13 NOTE — Interval H&P Note (Signed)
 History and Physical Interval Note:  03/13/2024 12:31 PM  Levi Sheppard  has presented today for surgery, with the diagnosis of Pharyngoesophageal dysphagia [R13.14]   Globus sensation [R09.A2] GERD without esophagitis [K21.9].  The various methods of treatment have been discussed with the patient and family. After consideration of risks, benefits and other options for treatment, the patient has consented to  Procedure(s) with comments: EGD (ESOPHAGOGASTRODUODENOSCOPY) (N/A) - Patient request Mid Morning as a surgical intervention.  The patient's history has been reviewed, patient examined, no change in status, stable for surgery.  I have reviewed the patient's chart and labs.  Questions were answered to the patient's satisfaction.     [place], [place]

## 2024-03-13 NOTE — Transfer of Care (Signed)
 Immediate Anesthesia Transfer of Care Note  Patient: Levi Sheppard  Procedure(s) Performed: EGD (ESOPHAGOGASTRODUODENOSCOPY)  Patient Location: PACU  Anesthesia Type:General  Level of Consciousness: sedated  Airway & Oxygen Therapy: Patient Spontanous Breathing  Post-op Assessment: Report given to RN and Post -op Vital signs reviewed and stable  Post vital signs: Reviewed and stable  Last Vitals:  Vitals Value Taken Time  BP 106/91 03/13/24 13:00  Temp    Pulse 103 03/13/24 13:00  Resp 19 03/13/24 13:01  SpO2 98 % 03/13/24 13:00  Vitals shown include unfiled device data.  Last Pain:  Vitals:   03/13/24 1136  TempSrc: Temporal  PainSc: 3          Complications: No notable events documented.

## 2024-03-13 NOTE — Anesthesia Postprocedure Evaluation (Signed)
 Anesthesia Post Note  Patient: Levi Sheppard  Procedure(s) Performed: EGD (ESOPHAGOGASTRODUODENOSCOPY)  Patient location during evaluation: Endoscopy Anesthesia Type: General Level of consciousness: awake and alert Pain management: pain level controlled Vital Signs Assessment: post-procedure vital signs reviewed and stable Respiratory status: spontaneous breathing, nonlabored ventilation, respiratory function stable and patient connected to nasal cannula oxygen Cardiovascular status: blood pressure returned to baseline and stable Postop Assessment: no apparent nausea or vomiting Anesthetic complications: no   No notable events documented.   Last Vitals:  Vitals:   03/13/24 1310 03/13/24 1320  BP: (!) 130/92 (!) 121/93  Pulse: 77 86  Resp: 20 15  Temp:    SpO2: 93% 91%    Last Pain:  Vitals:   03/13/24 1320  TempSrc:   PainSc: 0-No pain                 Lendia LITTIE Mae

## 2024-03-13 NOTE — Op Note (Signed)
 [facility] Gastroenterology Patient Name: Levi Sheppard Procedure Date: 03/13/2024 12:28 PM MRN: [id number] Account #: [id number] Date of Birth: Aug 14, 1970 Admit Type: Outpatient Age: 53 Room: [facility] ENDO ROOM 2 Gender: Male Note Status: Finalized Instrument Name: Upper GI Scope [id number] Procedure:             Upper GI endoscopy Indications:           Pharyngeal phase dysphagia, Gastro-esophageal reflux                         disease Providers:             Nikki Rusnak K. Aundria MD, MD Referring MD:          Oneil PHEBE Pinal, MD (Referring MD) Medicines:             Propofol  per Anesthesia Complications:         No immediate complications. Estimated blood loss:                         Minimal. Procedure:             Pre-Anesthesia Assessment:                        - The risks and benefits of the procedure and the                         sedation options and risks were discussed with the                         patient. All questions were answered and informed                         consent was obtained.                        - Patient identification and proposed procedure were                         verified prior to the procedure by the nurse. The                         procedure was verified in the procedure room.                        - ASA Grade Assessment: III - A patient with severe                         systemic disease.                        - After reviewing the risks and benefits, the patient                         was deemed in satisfactory condition to undergo the                         procedure.                        After obtaining informed consent, the  endoscope was                         passed under direct vision. Throughout the procedure,                         the patient's blood pressure, pulse, and oxygen                         saturations were monitored continuously. The Endoscope                         was introduced through the  mouth, and advanced to the                         third part of duodenum. The upper GI endoscopy was                         accomplished without difficulty. The patient tolerated                         the procedure well. Findings:      No endoscopic abnormality was evident in the esophagus to explain the       patient's complaint of dysphagia.      Two tongues of salmon-colored mucosa were present from 36 to 37 cm. No       other visible abnormalities were present. The maximum longitudinal       extent of these esophageal mucosal changes was 1 cm in length. Mucosa       was biopsied with a cold forceps for histology. One specimen bottle was       sent to pathology. Estimated blood loss was minimal.      Patchy mild inflammation characterized by congestion (edema) and       erythema was found in the gastric body and in the gastric antrum. Two       biopsies were obtained with cold forceps for Helicobacter pylori testing       in the gastric body. Estimated blood loss was minimal.      Few non-bleeding superficial gastric ulcers with no stigmata of bleeding       were found in the gastric antrum. Biopsies were taken with a cold       forceps for histology. Estimated blood loss was minimal.      The exam of the stomach was otherwise normal.      The examined duodenum was normal. Impression:            - No endoscopic esophageal abnormality to explain                         patient's dysphagia.                        - Salmon-colored mucosa suspicious for Barrett's                         esophagus. Biopsied.                        - Gastritis.                        -  Non-bleeding gastric ulcers with no stigmata of                         bleeding. Biopsied.                        - Normal examined duodenum.                        - Biopsies performed in the gastric body. Recommendation:        - Patient has a contact number available for                         emergencies. The  signs and symptoms of potential                         delayed complications were discussed with the patient.                         Return to normal activities tomorrow. Written                         discharge instructions were provided to the patient.                        - Resume previous diet.                        - Continue present medications.                        - Await pathology results.                        - Use Protonix  (pantoprazole ) 40 mg PO BID.                        - No aspirin , ibuprofen, naproxen, or other                         non-steroidal anti-inflammatory drugs.                        - Return to GI office in 2 months.                        - Telephone GI office to schedule appointment.                        - The findings and recommendations were discussed with                         the patient. Procedure Code(s):     --- Professional ---                        [phone], Esophagogastroduodenoscopy, flexible,                         transoral; with biopsy, single or multiple Diagnosis Code(s):     --- Professional ---  K21.9, Gastro-esophageal reflux disease without                         esophagitis                        R13.13, Dysphagia, pharyngeal phase                        K25.9, Gastric ulcer, unspecified as acute or chronic,                         without hemorrhage or perforation                        K29.70, Gastritis, unspecified, without bleeding                        K22.89, Other specified disease of esophagus CPT copyright 2022 American Medical Association. All rights reserved. The codes documented in this report are preliminary and upon coder review may  be revised to meet current compliance requirements. Ladell MARLA Boss MD, MD 03/13/2024 1:03:23 PM This report has been signed electronically. Number of Addenda: 0 Note Initiated On: 03/13/2024 12:28 PM Estimated Blood Loss:  Estimated blood loss was  minimal.      [facility]

## 2024-03-13 NOTE — Anesthesia Preprocedure Evaluation (Signed)
 Anesthesia Evaluation  Patient identified by MRN, date of birth, ID band Patient awake    Reviewed: Allergy & Precautions, NPO status , Patient's Chart, lab work & pertinent test results  History of Anesthesia Complications Negative for: history of anesthetic complications  Airway Mallampati: III  TM Distance: >3 FB Neck ROM: full    Dental no notable dental hx.    Pulmonary neg pulmonary ROS, Current Smoker and Patient abstained from smoking.   Pulmonary exam normal        Cardiovascular hypertension, On Medications negative cardio ROS Normal cardiovascular exam     Neuro/Psych negative neurological ROS  negative psych ROS   GI/Hepatic Neg liver ROS,GERD  Medicated and Controlled,,  Endo/Other    Class 3 obesity  Renal/GU negative Renal ROS  negative genitourinary   Musculoskeletal   Abdominal   Peds  Hematology negative hematology ROS (+)   Anesthesia Other Findings Past Medical History: No date: GERD (gastroesophageal reflux disease) No date: Hypertension No date: Pinched nerve in neck     Comment:  c4-C5. No limitations of neck movement  Past Surgical History: 08/26/2015: COLONOSCOPY; N/A     Comment:  Procedure: COLONOSCOPY;  Surgeon: Deward CINDERELLA Piedmont, MD;                Location: [facility];  Service:               Gastroenterology;  Laterality: N/A; 08/26/2015: ESOPHAGOGASTRODUODENOSCOPY; N/A     Comment:  Procedure: ESOPHAGOGASTRODUODENOSCOPY (EGD);  Surgeon:               Deward CINDERELLA Piedmont, MD;  Location: [facility];  Service:               Gastroenterology;  Laterality: N/A; 01/28/2019: LEFT HEART CATH AND CORONARY ANGIOGRAPHY; Right     Comment:  Procedure: ,with possible intervention;  Surgeon: Fernand Denyse LABOR, MD;  Location: [facility] INVASIVE CV LAB;  Service:              Cardiovascular;  Laterality: Right; 1986: NASAL SEPTUM SURGERY 2007: SEPTOPLASTY     Comment:  with submucous  resection  BMI    Body Mass Index: 41.62 kg/m      Reproductive/Obstetrics negative OB ROS                              Anesthesia Physical Anesthesia Plan  ASA: 3  Anesthesia Plan: General   Post-op Pain Management: Minimal or no pain anticipated   Induction: Intravenous  PONV Risk Score and Plan: 1 and Propofol  infusion and TIVA  Airway Management Planned: Natural Airway and Nasal Cannula  Additional Equipment:   Intra-op Plan:   Post-operative Plan:   Informed Consent: I have reviewed the patients History and Physical, chart, labs and discussed the procedure including the risks, benefits and alternatives for the proposed anesthesia with the patient or authorized representative who has indicated his/her understanding and acceptance.     Dental Advisory Given  Plan Discussed with: Anesthesiologist, CRNA and Surgeon  Anesthesia Plan Comments: (Patient consented for risks of anesthesia including but not limited to:  - adverse reactions to medications - risk of airway placement if required - damage to eyes, teeth, lips or other oral mucosa - nerve damage due to positioning  - sore throat or hoarseness - Damage to heart, brain, nerves,  lungs, other parts of body or loss of life  Patient voiced understanding and assent.)        Anesthesia Quick Evaluation

## 2024-03-14 LAB — SURGICAL PATHOLOGY
